# Patient Record
Sex: Male | Born: 1985
Health system: Southern US, Community
[De-identification: ages and names within clinical notes are randomized; demographics above are authoritative.]

## PROBLEM LIST (undated history)

## (undated) DIAGNOSIS — I10 Essential (primary) hypertension: Secondary | ICD-10-CM

## (undated) HISTORY — PX: APPENDECTOMY: SHX54

## (undated) HISTORY — PX: ORBITAL RECONSTRUCTION: SHX2115

## (undated) HISTORY — PX: SHOULDER ARTHROSCOPY: SHX128

## (undated) HISTORY — DX: Essential (primary) hypertension: I10

---
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
## 2007-02-26 ENCOUNTER — Emergency Department (HOSPITAL_COMMUNITY): Admission: EM | Admit: 2007-02-26 | Discharge: 2007-02-26 | Payer: Self-pay | Admitting: Emergency Medicine

## 2007-07-28 ENCOUNTER — Emergency Department (HOSPITAL_COMMUNITY): Admission: EM | Admit: 2007-07-28 | Discharge: 2007-07-28 | Payer: Self-pay | Admitting: Emergency Medicine

## 2008-01-28 ENCOUNTER — Emergency Department (HOSPITAL_COMMUNITY): Admission: EM | Admit: 2008-01-28 | Discharge: 2008-01-28 | Payer: Self-pay | Admitting: Emergency Medicine
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
## 2010-08-14 NOTE — Op Note (Signed)
   NAME:  Nicolas Warren, Nicolas Warren                        ACCOUNT NO.:  [id number]   MEDICAL RECORD NO.:  [id number]                   PATIENT TYPE:  INP   LOCATION:  1826                                 FACILITY:  [facility]   PHYSICIAN:  Prabhakar D. Pendse, M.D.           DATE OF BIRTH:  27-Sep-1985   DATE OF PROCEDURE:  06/18/2002  DATE OF DISCHARGE:                                 OPERATIVE REPORT   PREOPERATIVE DIAGNOSIS:  Acute appendicitis.   POSTOPERATIVE DIAGNOSIS:  Acute appendicitis.   OPERATION/PROCEDURE:  1. Exploratory laparotomy.  2. Appendectomy.   SURGEON:  Prabhakar D. Levie Heritage, M.D.   ASSISTANT:  Nurse.   ANESTHESIA:  General.   DESCRIPTION OF PROCEDURE:  Under satisfactory general endotracheal  anesthesia, the patient in the supine position, abdominal was thoroughly  prepped and draped in the usual manner.  About 4 cm long transverse incision  was made in the right lower quadrant area.  Skin and subcutaneous tissue  were incised.  Bleeders were individually clamped, cut and  electrocoagulated.  All the muscles were incised in the McBurney fashion.  Peritoneal cavity entered.  Exploration revealed moderate quantity of straw-  colored fluid in the right lower quadrant area.  Appendix was about three  inches long but the distal appendix markedly distended, congested with  serositis consistent with acute appendicitis without perforation.  Examination of the distal ileum showed no evidence of obvious ileitis except  for minimal congestion.  The distal ileum also did not show any evidence of  Meckel's diverticulum.  The bowel was returned to the peritoneal cavity.  Appendiceal mesentery was serially clamped, cut and ligated with 3-0 silk.  Appendectomy done in the routine fashion.  Stump was buried in the cecal  wall with 3-0 silk pursestring suture.  Hemostasis being satisfactory and  sponge and needle count being correct, peritoneum was closed with 2-0 Vicryl  running  interlocking sutures.  The incision was irrigated with copious  amount of saline.  Muscles approximated with 2-0 Vicryl interrupted sutures,  subcutaneous tissue with 2-0 Vicryl.  Skin closed with 4-0 Monocryl  subcuticular sutures.  Steri-Strips applied.  Appropriate dressing applied.  Throughout the procedure, the patient's vital signs remained stable.  The  patient withstood the procedure well and was transferred to the recovery  room in satisfactory general condition.                                               Prabhakar D. Levie Heritage, M.D.    PDP/MEDQ  D:  06/18/2002  T:  06/18/2002  Job:  [id number]   cc:   Titus Dubin. Alwyn Ren, M.D. [facility]

## 2011-01-12 LAB — POCT HEMOGLOBIN-HEMACUE: Operator id: 12362

## 2011-04-09 ENCOUNTER — Encounter (HOSPITAL_COMMUNITY): Payer: Self-pay | Admitting: Behavioral Health

## 2011-04-09 ENCOUNTER — Ambulatory Visit (HOSPITAL_COMMUNITY)
Admission: RE | Admit: 2011-04-09 | Discharge: 2011-04-09 | Disposition: A | Discharge status: Home / Self Care | Payer: 59 | Source: Ambulatory Visit | Attending: Psychiatry | Admitting: Psychiatry

## 2011-04-09 DIAGNOSIS — F331 Major depressive disorder, recurrent, moderate: Secondary | ICD-10-CM | POA: Insufficient documentation

## 2011-04-09 DIAGNOSIS — F1020 Alcohol dependence, uncomplicated: Secondary | ICD-10-CM | POA: Insufficient documentation

## 2011-04-09 DIAGNOSIS — F1220 Cannabis dependence, uncomplicated: Secondary | ICD-10-CM | POA: Insufficient documentation
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
## 2012-05-23 ENCOUNTER — Ambulatory Visit: Payer: Self-pay | Admitting: Internal Medicine

## 2012-06-17 ENCOUNTER — Encounter (HOSPITAL_COMMUNITY): Payer: Self-pay | Admitting: Emergency Medicine

## 2012-06-17 ENCOUNTER — Emergency Department (INDEPENDENT_AMBULATORY_CARE_PROVIDER_SITE_OTHER): Payer: 59

## 2012-06-17 ENCOUNTER — Emergency Department (HOSPITAL_COMMUNITY)
Admission: EM | Admit: 2012-06-17 | Discharge: 2012-06-17 | Disposition: A | Discharge status: Home / Self Care | Payer: 59 | Source: Home / Self Care | Attending: Emergency Medicine | Admitting: Emergency Medicine

## 2012-06-17 DIAGNOSIS — M79672 Pain in left foot: Secondary | ICD-10-CM

## 2012-06-17 DIAGNOSIS — M79609 Pain in unspecified limb: Secondary | ICD-10-CM

## 2012-06-17 MED ORDER — MELOXICAM 7.5 MG PO TABS: 7.5000 mg | ORAL_TABLET | Freq: Every day | ORAL | Status: DC
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
## 2012-06-17 NOTE — ED Provider Notes (Signed)
History     CSN: [id number]  Arrival date & time 06/17/12  1514   First MD Initiated Contact with Patient 06/17/12 1519      Chief Complaint  Patient presents with  . 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 He denies any recent injury or falls or any type of trauma that he can recall. Pain is worse and gets exacerbated when he walks swollen when he touches the dorsum aspect of his left foot. Patient denies any numbness or tingling sensation. Have been taking some naproxen that he had left over from a previous shoulder injury doesn't seem to be helping much. Denies any previous injuries or trauma or previous fractures of his left foot.  Patient is a 27 y.o. male presenting with lower extremity pain.  Foot Pain This is a new problem. The current episode started more than 1 week ago. The problem occurs constantly. The problem has not changed since onset.The symptoms are aggravated by walking. The symptoms are relieved by ice, NSAIDs and rest. Treatments tried: Aleve. The treatment provided mild relief.    Past Medical History  Diagnosis Date  . No pertinent past medical history     History reviewed. No pertinent past surgical history.  No family history on file.  History  Substance Use Topics  . Smoking status: Current Every Day Smoker -- 1.00 packs/day    Types: Cigarettes  . Smokeless tobacco: Not on file  . Alcohol Use: 1.8 oz/week    3 Cans of beer per week      Review of Systems  Constitutional: Positive for activity change. Negative for fever.  Musculoskeletal: Negative for back pain, joint swelling and arthralgias.  Skin: Negative for color change, pallor, rash and wound.  Neurological: Negative for weakness and numbness.     Allergies  Review of patient's allergies indicates no known allergies.  Home Medications   Current Outpatient Rx  Name  Route  Sig  Dispense  Refill  . meloxicam (MOBIC) 7.5 MG tablet   Oral   Take 1 tablet (7.5 mg total) by mouth daily. Take one tablet daily for 2 weeks   14 tablet   0     BP 152/92  Pulse 90  Temp(Src) 98.1 F (36.7 C) (Oral)  Resp 18  SpO2 95%  Physical Exam  Constitutional: He appears well-developed and well-nourished.  Musculoskeletal:       Feet:  Neurological: He is alert.  Skin: No rash noted. No erythema.    ED Course  Procedures (including critical care time)  Labs Reviewed - No data to display Dg Foot Complete Left  06/17/2012  *RADIOLOGY REPORT*  Clinical Data: Pain for 2 weeks  LEFT  FOOT COMPLETE - 3+ VIEW  Comparison: None.  Findings: Three views left foot submitted.  No acute fracture or subluxation.  No radiopaque foreign body.  IMPRESSION: No acute fracture or subluxation.   Original Report Authenticated By: Natasha Mead, M.D.      1. Foot pain, left       MDM  Current physical exam and symptoms were most consistent with tenosynovitis of the foot. Have discussed with patient that if pain was to persist especially with weight-bearing activities that he needs to followup with the orthopedic Dr. to be further evaluated. Have encouraged patient to use supportive  shoe wear including an Ace wrap on his using his boots and a shoe insert have also encouraged patient to take meloxicam for the next 10-14 days. Patient agrees with treatment plan and followup care as necessary.   Patient has also noted to be a chronic smoker we also had a discussion of the benefits of smoking cessation. Patient is pre-contemplative about quitting smoking        Jimmie Molly, MD 06/17/12 1726

## 2012-08-15 ENCOUNTER — Encounter: Payer: Self-pay | Admitting: Family Medicine

## 2012-08-15 ENCOUNTER — Ambulatory Visit (INDEPENDENT_AMBULATORY_CARE_PROVIDER_SITE_OTHER): Payer: 59 | Admitting: Family Medicine

## 2012-08-15 VITALS — BP 152/92 | HR 72 | Temp 97.8°F | Resp 18 | Ht 70.5 in | Wt 208.0 lb

## 2012-08-15 DIAGNOSIS — J309 Allergic rhinitis, unspecified: Secondary | ICD-10-CM

## 2012-08-15 DIAGNOSIS — I10 Essential (primary) hypertension: Secondary | ICD-10-CM

## 2012-08-15 HISTORY — DX: Essential (primary) hypertension: I10

## 2012-08-15 MED ORDER — CETIRIZINE HCL 10 MG PO TABS: 10.0000 mg | ORAL_TABLET | Freq: Every day | ORAL | Status: DC

## 2012-08-15 MED ORDER — METHYLPREDNISOLONE ACETATE 40 MG/ML IJ SUSP: 60.0000 mg | Freq: Once | INTRAMUSCULAR | Status: AC

## 2012-08-15 MED ORDER — METHYLPREDNISOLONE ACETATE 40 MG/ML IJ SUSP: 60.0000 mg | Freq: Once | INTRAMUSCULAR | Status: DC

## 2012-08-15 MED ORDER — FLUTICASONE PROPIONATE 50 MCG/ACT NA SUSP: 2.0000 | Freq: Every day | NASAL | Status: DC

## 2012-08-15 MED ORDER — METHYLPREDNISOLONE ACETATE 80 MG/ML IJ SUSP
60.0000 mg | Freq: Once | INTRAMUSCULAR | Status: AC
  Administered 2012-08-15: 60 mg via INTRAMUSCULAR

## 2012-08-15 NOTE — Progress Notes (Signed)
  Subjective:    Patient ID: Nicolas Warren, male    DOB: 03/19/1986, 27 y.o.   MRN: [id number]  HPI  Patient presents with one week of pressure and congestion in his face and sinuses. He's having constant rhinorrhea and postnasal drip. History of some over-the-counter sinus medicine without relief. He denies any fever, headaches, pain in his maxillary or frontal sinuses, or tooth pain. He does have some postnasal drip, sneezing, and cough.  Of note his blood pressure today in clinic is elevated at 152/92. I reviewed his previous office notes in her chart. His diastolic pressure has been around 90 in the past.  He denies any history of hypertension. Past Medical History  Diagnosis Date  . No pertinent past medical history   . HTN (hypertension) 08/15/2012   No current outpatient prescriptions on file prior to visit.   No current facility-administered medications on file prior to visit.   No Known Allergies   Review of Systems  All other systems reviewed and are negative.       Objective:   Physical Exam  Vitals reviewed. Constitutional: He appears well-developed and well-nourished.  HENT:  Head: Normocephalic.  Right Ear: Tympanic membrane, external ear and ear canal normal.  Left Ear: Tympanic membrane, external ear and ear canal normal.  Nose: Mucosal edema and rhinorrhea present. No epistaxis. Right sinus exhibits no maxillary sinus tenderness and no frontal sinus tenderness. Left sinus exhibits no maxillary sinus tenderness and no frontal sinus tenderness.  Mouth/Throat: Uvula is midline, oropharynx is clear and moist and mucous membranes are normal. No oropharyngeal exudate.  Cardiovascular: Normal rate, regular rhythm and normal heart sounds.   No murmur heard. Pulmonary/Chest: Effort normal and breath sounds normal. No respiratory distress. He has no wheezes. He has no rales.  Abdominal: Soft. Bowel sounds are normal. He exhibits no distension. There is no  tenderness. There is no rebound and no guarding.          Assessment & Plan:  1. Allergic rhinosinusitis Past patient start taking Zyrtec 10 mg by mouth daily and Flonase 2 sprays each nostril inhaled daily. He would like to try to expedite the resolution of his symptoms by getting injection of Depo-Medrol 60 mg IM x1. We discussed the risk and benefits of steroids and he elects to proceed.  If fever develops or symptoms worsen, I would be glad to treat him as an infectious sinusitis. However his symptoms appear to be allergies at the present time. - cetirizine (ZYRTEC) 10 MG tablet; Take 1 tablet (10 mg total) by mouth daily.  Dispense: 30 tablet; Refill: 11 - fluticasone (FLONASE) 50 MCG/ACT nasal spray; Place 2 sprays into the nose daily.  Dispense: 16 g; Refill: 6 - methylPREDNISolone acetate (DEPO-MEDROL) injection 60 mg; Inject 1.5 mLs (60 mg total) into the muscle once.  2. HTN (hypertension) Past patient check his blood pressure everyday for the next one to 2 weeks. He will then drop his blood pressure off for me to review. His blood pressures truly elevated and is not due to sinus medication, I would initiate a workup for causes of hypertension in a young man and treat if greater than 140/90.

## 2012-09-09 ENCOUNTER — Encounter (HOSPITAL_COMMUNITY): Payer: Self-pay | Admitting: Emergency Medicine

## 2012-09-09 ENCOUNTER — Emergency Department (INDEPENDENT_AMBULATORY_CARE_PROVIDER_SITE_OTHER)
Admission: EM | Admit: 2012-09-09 | Discharge: 2012-09-09 | Disposition: A | Discharge status: Home / Self Care | Payer: 59 | Source: Home / Self Care

## 2012-09-09 DIAGNOSIS — L089 Local infection of the skin and subcutaneous tissue, unspecified: Secondary | ICD-10-CM

## 2012-09-09 MED ORDER — SULFAMETHOXAZOLE-TRIMETHOPRIM 800-160 MG PO TABS: 1.0000 | ORAL_TABLET | Freq: Two times a day (BID) | ORAL | Status: DC

## 2012-09-09 NOTE — ED Notes (Signed)
Pt c/o infection of right middle finger x 1 wk. Mild swelling Denies fever and drainage. Pt has used advil for pain relief.

## 2012-09-09 NOTE — ED Provider Notes (Signed)
History     CSN: [id number]  Arrival date & time 09/09/12  1435   First MD Initiated Contact with Patient 09/09/12 1552      Chief Complaint  Patient presents with  . Hand Problem    infection of right middle finger x 1 wk.     (Consider location/radiation/quality/duration/timing/severity/associated sxs/prior treatment) Patient is a 27 y.o. male presenting with hand pain. The history is provided by the patient. No language interpreter was used.  Hand Pain This is a new problem. The current episode started more than 1 week ago. The problem occurs constantly. Nothing aggravates the symptoms. Nothing relieves the symptoms. He has tried nothing for the symptoms.   Pt reports finger is swollen and painful,  Began from wearing wet gloves at work Past Medical History  Diagnosis Date  . No pertinent past medical history   . HTN (hypertension) 08/15/2012    History reviewed. No pertinent past surgical history.  History reviewed. No pertinent family history.  History  Substance Use Topics  . Smoking status: Current Some Day Smoker -- 1.00 packs/day    Types: Cigarettes  . Smokeless tobacco: Never Used  . Alcohol Use: 1.8 oz/week    3 Cans of beer per week      Review of Systems  All other systems reviewed and are negative.    Allergies  Review of patient's allergies indicates no known allergies.  Home Medications   Current Outpatient Rx  Name  Route  Sig  Dispense  Refill  . cetirizine (ZYRTEC) 10 MG tablet   Oral   Take 1 tablet (10 mg total) by mouth daily.   30 tablet   11   . fluticasone (FLONASE) 50 MCG/ACT nasal spray   Nasal   Place 2 sprays into the nose daily.   16 g   6     BP 129/75  Pulse 60  Resp 16  SpO2 97%  Physical Exam  Nursing note and vitals reviewed. Constitutional: He is oriented to person, place, and time. He appears well-developed and well-nourished.  HENT:  Head: Normocephalic.  Musculoskeletal: He exhibits tenderness.   Swollen right 3rd finger,    Neurological: He is alert and oriented to person, place, and time. He has normal reflexes.  Skin: Skin is warm.  Psychiatric: He has a normal mood and affect.    ED Course  Procedures (including critical care time)  Labs Reviewed - No data to display No results found.   No diagnosis found.    MDM  I attempted to open with 18 gauge,  No drainage  Pt advised to soak area,   Rx for bactrim         Elson Areas, PA-C 09/09/12 1628

## 2012-09-12 ENCOUNTER — Encounter: Payer: Self-pay | Admitting: Family Medicine

## 2012-09-12 ENCOUNTER — Ambulatory Visit (INDEPENDENT_AMBULATORY_CARE_PROVIDER_SITE_OTHER): Payer: 59 | Admitting: Family Medicine

## 2012-09-12 VITALS — BP 130/80 | HR 86 | Temp 97.4°F | Resp 20 | Ht 71.0 in | Wt 212.0 lb

## 2012-09-12 DIAGNOSIS — IMO0002 Reserved for concepts with insufficient information to code with codable children: Secondary | ICD-10-CM

## 2012-09-12 DIAGNOSIS — L03011 Cellulitis of right finger: Secondary | ICD-10-CM

## 2012-09-12 MED ORDER — HYDROCODONE-ACETAMINOPHEN 5-325 MG PO TABS: 1.0000 | ORAL_TABLET | Freq: Four times a day (QID) | ORAL | Status: DC | PRN

## 2012-09-12 NOTE — Progress Notes (Signed)
  Subjective:    Patient ID: Nicolas Warren, male    DOB: March 23, 1986, 27 y.o.   MRN: [id number]  HPI  Patient here secondary to infection on the tip of his right finger. He was seen in urgent care 3 days ago was given prescription for Bactrim after ? Aspiration was attempted unsuccessful. He continues to have swelling and pain of the finger. He states it started after wearing his clothes from work which he did not clean out. He denies any fever or drainage.    Review of Systems - per above GEN- denies fatigue, fever, weight loss,weakness, recent illness MSK- denies joint pain, muscle aches, injury        Objective:   Physical Exam GEN-NAD,alert and oriented x 3 SKIN- Right hand middle finger + moderate size paranychia along nail fold toward right side of nail TTP, no drainage, + erythema. Black debris around the nail bed MSK- normal ROM PIP, MIP,DIP Fingers  Procedure- Incision and Drainage Procedure explained to patient questions answered benefits and risks discussed oral consent obtained. Antiseptic-Betadine Anesthesia-lidocaine - nerve block middle finger Incision performed in j shape around nail- small amount of pus expressed Minimal blood loss Patient tolerated procedure well Bandage applied      Assessment & Plan:

## 2012-09-12 NOTE — Assessment & Plan Note (Signed)
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

## 2012-09-12 NOTE — Patient Instructions (Addendum)
Complete antibiotics Take bandage off after 48hours Soak in warm water four times a day If this swelling is not improving or infection worsens call for appt for Friday Fingertip Infection When an infection is around the nail, it is called a paronychia. When it appears over the tip of the finger, it is called a felon. These infections are due to minor injuries or cracks in the skin. If they are not treated properly, they can lead to bone infection and permanent damage to the fingernail. Incision and drainage is necessary if a pus pocket (an abscess) has formed. Antibiotics and pain medicine may also be needed. Keep your hand elevated for the next 2-3 days to reduce swelling and pain. If a pack was placed in the abscess, it should be removed in 1-2 days by your caregiver. Soak the finger in warm water for 20 minutes 4 times daily to help promote drainage. Keep the hands as dry as possible. Wear protective gloves with cotton liners. See your caregiver for follow-up care as recommended.  HOME CARE INSTRUCTIONS   Keep wound clean, dry and dressed as suggested by your caregiver.  Soak in warm salt water for fifteen minutes, four times per day for bacterial infections.  Your caregiver will prescribe an antibiotic if a bacterial infection is suspected. Take antibiotics as directed and finish the prescription, even if the problem appears to be improving before the medicine is gone.  Only take over-the-counter or prescription medicines for pain, discomfort, or fever as directed by your caregiver. SEEK IMMEDIATE MEDICAL CARE IF:  There is redness, swelling, or increasing pain in the wound.  Pus or any other unusual drainage is coming from the wound.  An unexplained oral temperature above 102 F (38.9 C) develops.  You notice a foul smell coming from the wound or dressing. MAKE SURE YOU:   Understand these instructions.  Monitor your condition.  Contact your caregiver if you are getting worse or  not improving. Document Released: 04/22/2004 Document Revised: 06/07/2011 Document Reviewed: 04/18/2008 [facility] Patient Information 2014 [place], [place].

## 2013-06-03 ENCOUNTER — Emergency Department: Payer: Self-pay | Admitting: Emergency Medicine

## 2013-06-04 ENCOUNTER — Ambulatory Visit: Payer: BC Managed Care – PPO

## 2013-08-13 ENCOUNTER — Encounter: Payer: Self-pay | Admitting: Family Medicine

## 2013-08-13 ENCOUNTER — Ambulatory Visit (INDEPENDENT_AMBULATORY_CARE_PROVIDER_SITE_OTHER): Payer: BC Managed Care – PPO | Admitting: Family Medicine

## 2013-08-13 VITALS — BP 126/74 | HR 74 | Temp 97.9°F | Resp 18 | Ht 71.0 in | Wt 213.0 lb

## 2013-08-13 DIAGNOSIS — N50812 Left testicular pain: Secondary | ICD-10-CM

## 2013-08-13 DIAGNOSIS — N509 Disorder of male genital organs, unspecified: Secondary | ICD-10-CM

## 2013-08-13 LAB — URINALYSIS, ROUTINE W REFLEX MICROSCOPIC
Bilirubin Urine: NEGATIVE
Glucose, UA: NEGATIVE mg/dL
HGB URINE DIPSTICK: NEGATIVE
Ketones, ur: NEGATIVE mg/dL
LEUKOCYTES UA: NEGATIVE
Nitrite: NEGATIVE
PROTEIN: NEGATIVE mg/dL
Urobilinogen, UA: 0.2 mg/dL (ref 0.0–1.0)
pH: 6 (ref 5.0–8.0)

## 2013-08-13 NOTE — Progress Notes (Signed)
Subjective:    Patient ID: Nicolas Warren, male    DOB: 07/19/1985, [age] y.o.   MRN: [id number]  HPI Patient is concerned he may have developed an inguinal canal hernia. He does a lot of heavy lifting at work. Over the last week he has had aching pain in his left inguinal canal radiating into his left testicle. He denies any masses or swelling in the inguinal canal or in the scrotum. The left testicle is tender to palpation. Examination today the left testicle is not swollen or tender to palpation. The epididymis is not tender to palpation. There is no palpable inguinal hernia. There is no inguinal lymphadenopathy. The patient denies any dysuria or urethral discharge he states it's possible he has been exposed STI's. Past Medical History  Diagnosis Date  . No pertinent past medical history   . HTN (hypertension) 08/15/2012   No current outpatient prescriptions on file prior to visit.   Current Facility-Administered Medications on File Prior to Visit  Medication Dose Route Frequency Provider Last Rate Last Dose  . methylPREDNISolone acetate (DEPO-MEDROL) injection 60 mg  60 mg Intramuscular Once Donita BrooksWarren T Pickard, MD       No Known Allergies History   Social History  . Marital Status: Single    Spouse Name: N/A    Number of Children: N/A  . Years of Education: N/A   Occupational History  . Not on file.   Social History Main Topics  . Smoking status: Current Some Day Smoker -- 1.00 packs/day    Types: Cigarettes  . Smokeless tobacco: Never Used  . Alcohol Use: 1.8 oz/week    3 Cans of beer per week  . Drug Use: 4.00 per week    Special: Marijuana  . Sexual Activity: Not on file   Other Topics Concern  . Not on file   Social History Narrative  . No narrative on file      Review of Systems  All other systems reviewed and are negative.      Objective:   Physical Exam  Vitals reviewed. Cardiovascular: Normal rate, regular rhythm and normal heart sounds.     Pulmonary/Chest: Effort normal and breath sounds normal. No respiratory distress. He has no wheezes. He has no rales.  Abdominal: Soft. Bowel sounds are normal. He exhibits no distension. There is no tenderness. There is no rebound. Hernia confirmed negative in the right inguinal area and confirmed negative in the left inguinal area.  Genitourinary: Testes normal and penis normal. Cremasteric reflex is present. Right testis shows no mass, no swelling and no tenderness. Left testis shows no mass, no swelling and no tenderness. Left testis is descended. Circumcised. No phimosis, paraphimosis, hypospadias, penile erythema or penile tenderness. No discharge found.  Lymphadenopathy:       Right: No inguinal adenopathy present.       Left: No inguinal adenopathy present.          Assessment & Plan:  1. Testicular pain, left Today's exam is benign. The differential diagnosis includes epididymitis, inguinal hernia, muscle strain, benign testicular pain.  Do not appreciate a hernia today on examination. I do not appreciate any testicular masses. There is no evidence of a testicular torsion. I believe most likely the patient has strained a muscle there could be a clinically inapparent inguinal hernia. Rule out urinary tract infection and STI. I recommended ice and ibuprofen over the next week. If the patient's pain gradually improved no further workup is necessary. If the pain  worsens I would proceed with a ultrasound of the scrotum. - Urinalysis, Routine w reflex microscopic - GC/chlamydia probe amp, urine

## 2013-08-14 LAB — GC/CHLAMYDIA PROBE AMP, URINE
Chlamydia, Swab/Urine, PCR: NEGATIVE
GC Probe Amp, Urine: NEGATIVE

## 2013-10-30 ENCOUNTER — Encounter: Payer: Self-pay | Admitting: Family Medicine

## 2013-10-30 ENCOUNTER — Ambulatory Visit (INDEPENDENT_AMBULATORY_CARE_PROVIDER_SITE_OTHER): Payer: BC Managed Care – PPO | Admitting: Family Medicine

## 2013-10-30 VITALS — BP 110/68 | HR 80 | Temp 98.3°F | Resp 14 | Ht 71.0 in | Wt 211.0 lb

## 2013-10-30 DIAGNOSIS — J029 Acute pharyngitis, unspecified: Secondary | ICD-10-CM

## 2013-10-30 LAB — RAPID STREP SCREEN (MED CTR MEBANE ONLY): Streptococcus, Group A Screen (Direct): NEGATIVE

## 2013-10-30 NOTE — Progress Notes (Signed)
   Subjective:    Patient ID: Nicolas Warren, male    DOB: 11/11/1985, 28 y.o.   MRN: [id number]  HPI Patient symptoms began yesterday. He developed postnasal drip and a sore throat. Patient states that his friend had a sore throat last week and he believes that his friend has given him his infection. He reports subjective fevers. He does report rhinorrhea and nasal congestion. He also reports a nonproductive cough. He denies any otalgia or sinus pain. He denies any shortness of breath or chest pain. There is no rash on the skin. Past Medical History  Diagnosis Date  . No pertinent past medical history   . HTN (hypertension) 08/15/2012   No current outpatient prescriptions on file prior to visit.   Current Facility-Administered Medications on File Prior to Visit  Medication Dose Route Frequency Provider Last Rate Last Dose  . methylPREDNISolone acetate (DEPO-MEDROL) injection 60 mg  60 mg Intramuscular Once Donita BrooksWarren T Pickard, MD       No Known Allergies History   Social History  . Marital Status: Single    Spouse Name: N/A    Number of Children: N/A  . Years of Education: N/A   Occupational History  . Not on file.   Social History Main Topics  . Smoking status: Current Some Day Smoker -- 1.00 packs/day    Types: Cigarettes  . Smokeless tobacco: Never Used  . Alcohol Use: 1.8 oz/week    3 Cans of beer per week  . Drug Use: 4.00 per week    Special: Marijuana  . Sexual Activity: Not on file   Other Topics Concern  . Not on file   Social History Narrative  . No narrative on file      Review of Systems  All other systems reviewed and are negative.      Objective:   Physical Exam  Vitals reviewed. Constitutional: He appears well-developed and well-nourished. No distress.  HENT:  Right Ear: External ear normal.  Left Ear: External ear normal.  Nose: Nose normal. No mucosal edema or rhinorrhea. Right sinus exhibits no maxillary sinus tenderness and no frontal  sinus tenderness. Left sinus exhibits no maxillary sinus tenderness and no frontal sinus tenderness.  Mouth/Throat: Uvula is midline and mucous membranes are normal. Posterior oropharyngeal erythema present. No oropharyngeal exudate, posterior oropharyngeal edema or tonsillar abscesses.  Neck: Neck supple.  Cardiovascular: Normal rate, regular rhythm and normal heart sounds.   Pulmonary/Chest: Effort normal and breath sounds normal. No respiratory distress. He has no wheezes. He has no rales.  Lymphadenopathy:    He has no cervical adenopathy.  Skin: No rash noted. He is not diaphoretic. No erythema.          Assessment & Plan:  1. Sore throat Strep screen is negative. Symptoms and exam are consistent with viral pharyngitis Duke's magic mouthwash 5 mL po gargle and swallow q 4 hrs prn sore throat.  Recommend tincture of time and I anticipate spontaneous resolution over the next 4-5 days.  Gargle salt water QID.  Motrin 800 mg poq 8hrs prn discomfort. - Rapid strep screen

## 2014-01-14 ENCOUNTER — Encounter: Payer: Self-pay | Admitting: Family Medicine

## 2014-01-14 ENCOUNTER — Ambulatory Visit (INDEPENDENT_AMBULATORY_CARE_PROVIDER_SITE_OTHER): Payer: BC Managed Care – PPO | Admitting: Family Medicine

## 2014-01-14 VITALS — BP 126/76 | HR 68 | Temp 98.3°F | Resp 18 | Ht 71.0 in | Wt 217.0 lb

## 2014-01-14 DIAGNOSIS — I889 Nonspecific lymphadenitis, unspecified: Secondary | ICD-10-CM

## 2014-01-14 MED ORDER — AMOXICILLIN 875 MG PO TABS: 875.0000 mg | ORAL_TABLET | Freq: Two times a day (BID) | ORAL | Status: DC

## 2014-01-14 NOTE — Progress Notes (Signed)
   Subjective:    Patient ID: Nicolas Warren, male    DOB: 10/22/1985, 28 y.o.   MRN: [id number]  HPI Patient has tenderness and pain in his left neck just below the angle of his mandible near the anterior cervical chain. There are no palpable abnormalities in this area but this area is tender and sore. He denies any fevers or chills. He denies any sore throat. He denies any otalgia. He denies any cough or rhinorrhea. He has recently been around his nephews who have all had upper respiratory infections. Past Medical History  Diagnosis Date  . No pertinent past medical history   . HTN (hypertension) 08/15/2012   No past surgical history on file. No current outpatient prescriptions on file prior to visit.   Current Facility-Administered Medications on File Prior to Visit  Medication Dose Route Frequency Provider Last Rate Last Dose  . methylPREDNISolone acetate (DEPO-MEDROL) injection 60 mg  60 mg Intramuscular Once Donita BrooksWarren T Hayde Kilgour, MD       No Known Allergies History   Social History  . Marital Status: Single    Spouse Name: N/A    Number of Children: N/A  . Years of Education: N/A   Occupational History  . Not on file.   Social History Main Topics  . Smoking status: Current Some Day Smoker -- 1.00 packs/day    Types: Cigarettes  . Smokeless tobacco: Never Used  . Alcohol Use: 1.8 oz/week    3 Cans of beer per week  . Drug Use: 4.00 per week    Special: Marijuana  . Sexual Activity: Not on file   Other Topics Concern  . Not on file   Social History Narrative  . No narrative on file      Review of Systems  All other systems reviewed and are negative.      Objective:   Physical Exam  Vitals reviewed. Constitutional: He appears well-developed and well-nourished. No distress.  HENT:  Nose: Nose normal.  Mouth/Throat: Oropharynx is clear and moist. No oropharyngeal exudate.  Eyes: Conjunctivae are normal. No scleral icterus.  Neck: Neck supple. No  tracheal deviation present.  Cardiovascular: Normal rate, regular rhythm and normal heart sounds.   No murmur heard. Pulmonary/Chest: Effort normal and breath sounds normal. No stridor. No respiratory distress. He has no wheezes. He has no rales.  Abdominal: Soft. Bowel sounds are normal. He exhibits no distension and no mass. There is no tenderness. There is no rebound and no guarding.  Lymphadenopathy:    He has no cervical adenopathy.  Skin: Skin is warm. No rash noted. He is not diaphoretic. No erythema. No pallor.   patient is mildly tender to palpation in the left neck just below the angle of the mandible near the anterior cervical chain        Assessment & Plan:  Lymphadenitis - Plan: amoxicillin (AMOXIL) 875 MG tablet   Pain either represent a swollen tender lymph node that I am just not able to palpate or possibly a strain in the sternocleidomastoid muscle. I explained to the patient that I would not be surprised if he develops symptoms of an upper respiratory infection in the next 48-72 hours including rhinorrhea cough and sore throat. If he does, I recommended that he just treat the symptoms symptomatically and wait it out. If he develops a severe sore throat with exudate, I gave him a prescription for amoxicillin. However I anticipate self limited resolution of the next 72 hours.

## 2015-06-21 ENCOUNTER — Emergency Department
Admission: EM | Admit: 2015-06-21 | Discharge: 2015-06-21 | Disposition: A | Discharge status: Home / Self Care | Payer: BLUE CROSS/BLUE SHIELD | Source: Home / Self Care | Attending: Family Medicine | Admitting: Family Medicine

## 2015-06-21 DIAGNOSIS — R05 Cough: Secondary | ICD-10-CM

## 2015-06-21 DIAGNOSIS — J029 Acute pharyngitis, unspecified: Secondary | ICD-10-CM | POA: Diagnosis not present

## 2015-06-21 DIAGNOSIS — R059 Cough, unspecified: Secondary | ICD-10-CM

## 2015-06-21 LAB — POCT RAPID STREP A (OFFICE): Rapid Strep A Screen: NEGATIVE

## 2015-06-21 MED ORDER — BENZONATATE 100 MG PO CAPS: 100.0000 mg | ORAL_CAPSULE | Freq: Three times a day (TID) | ORAL | Status: DC

## 2015-06-21 NOTE — ED Provider Notes (Signed)
CSN: [id number]     Arrival date & time 06/21/15  1652 History   First MD Initiated Contact with Patient 06/21/15 1716     Chief Complaint  Patient presents with  . Sore Throat   (Consider location/radiation/quality/duration/timing/severity/associated sxs/prior Treatment) HPI  The pt is a 30yo male [facility] with sore throat and mild intermittent productive cough that started yesterday afternoon.  Pt reports coughing up clear mucous and having a mild generalized headache, worse this morning. He did take benadryl but no relief of sore throat. Denies sick contacts or recent travel.    Past Medical History  Diagnosis Date  . No pertinent past medical history   . HTN (hypertension) 08/15/2012   History reviewed. No pertinent past surgical history. History reviewed. No pertinent family history. Social History  Substance Use Topics  . Smoking status: Current Some Day Smoker -- 1.00 packs/day    Types: Cigarettes  . Smokeless tobacco: Never Used  . Alcohol Use: 1.8 oz/week    3 Cans of beer per week    Review of Systems  Constitutional: Negative for fever and chills.  HENT: Positive for congestion and sore throat. Negative for ear pain, trouble swallowing and voice change.   Respiratory: Positive for cough. Negative for shortness of breath.   Cardiovascular: Negative for chest pain and palpitations.  Gastrointestinal: Negative for nausea, vomiting, abdominal pain and diarrhea.  Musculoskeletal: Negative for myalgias, back pain and arthralgias.  Skin: Negative for rash.  Neurological: Positive for headaches. Negative for dizziness and light-headedness.    Allergies  Review of patient's allergies indicates no known allergies.  Home Medications   Prior to Admission medications   Medication Sig Start Date End Date Taking? Authorizing Provider  benzonatate (TESSALON) 100 MG capsule Take 1-2 capsules (100-200 mg total) by mouth every 8 (eight) hours. 06/21/15   Junius FinnerErin O'Malley,  PA-C   Meds Ordered and Administered this Visit  Medications - No data to display  BP 149/94 mmHg  Pulse 98  Temp(Src) 98 F (36.7 C) (Oral)  Ht 5\' 11"  (1.803 m)  Wt 215 lb 4 oz (97.637 kg)  BMI 30.03 kg/m2  SpO2 98% No data found.   Physical Exam  Constitutional: He appears well-developed and well-nourished.  HENT:  Head: Normocephalic and atraumatic.  Right Ear: Tympanic membrane normal.  Left Ear: Tympanic membrane normal.  Nose: Nose normal.  Mouth/Throat: Uvula is midline and mucous membranes are normal. Posterior oropharyngeal erythema present. No oropharyngeal exudate, posterior oropharyngeal edema or tonsillar abscesses.  Eyes: Conjunctivae are normal. No scleral icterus.  Neck: Normal range of motion. Neck supple.  Cardiovascular: Normal rate, regular rhythm and normal heart sounds.   Pulmonary/Chest: Effort normal and breath sounds normal. No stridor. No respiratory distress. He has no wheezes. He has no rales.  Abdominal: Soft. He exhibits no distension. There is no tenderness.  Musculoskeletal: Normal range of motion.  Lymphadenopathy:    He has no cervical adenopathy.  Neurological: He is alert.  Skin: Skin is warm and dry.  Nursing note and vitals reviewed.   ED Course  Procedures (including critical care time)  Labs Review Labs Reviewed  POCT RAPID STREP A (OFFICE)    Imaging Review No results found.    MDM   1. Sore throat   2. Cough    Pt c/o sore throat and cough since yesterday.  Mild pharyngeal erythema, otherwise normal exam. Rapid strep: Negative  Symptoms likely viral in nature. Rx: Tessalon  Advised pt to use  acetaminophen and ibuprofen as needed for fever and pain. Encouraged rest and fluids. F/u with PCP in 7-10 days if not improving, sooner if worsening. Pt verbalized understanding and agreement with tx plan.     Junius Finner, PA-C 06/22/15 1709

## 2015-06-21 NOTE — ED Notes (Signed)
Started with a sore throat yesterday afternoon.  Scratchy, headache, worse this am, coughing up mucous,

## 2015-06-21 NOTE — Discharge Instructions (Signed)
You may take 400-600mg Ibuprofen (Motrin) every 6-8 hours for fever and pain  °Alternate with Tylenol  °You may take 500mg Tylenol every 4-6 hours as needed for fever and pain  °Follow-up with your primary care provider next week for recheck of symptoms if not improving.  °Be sure to drink plenty of fluids and rest, at least 8hrs of sleep a night, preferably more while you are sick. °Return urgent care or go to closest ER if you cannot keep down fluids/signs of dehydration, fever not reducing with Tylenol, difficulty breathing/wheezing, stiff neck, worsening condition, or other concerns (see below)  ° ° °Cool Mist Vaporizers °Vaporizers may help relieve the symptoms of a cough and cold. They add moisture to the air, which helps mucus to become thinner and less sticky. This makes it easier to breathe and cough up secretions. Cool mist vaporizers do not cause serious burns like hot mist vaporizers, which may also be called steamers or humidifiers. Vaporizers have not been proven to help with colds. You should not use a vaporizer if you are allergic to mold. °HOME CARE INSTRUCTIONS °· Follow the package instructions for the vaporizer. °· Do not use anything other than distilled water in the vaporizer. °· Do not run the vaporizer all of the time. This can cause mold or bacteria to grow in the vaporizer. °· Clean the vaporizer after each time it is used. °· Clean and dry the vaporizer well before storing it. °· Stop using the vaporizer if worsening respiratory symptoms develop. °  °This information is not intended to replace advice given to you by your health care provider. Make sure you discuss any questions you have with your health care provider. °  °Document Released: 12/11/2003 Document Revised: 03/20/2013 Document Reviewed: 08/02/2012 °Elsevier Interactive Patient Education ©2016 Elsevier Inc. ° °

## 2015-12-07 ENCOUNTER — Encounter: Payer: Self-pay | Admitting: Emergency Medicine

## 2015-12-07 ENCOUNTER — Emergency Department
Admission: EM | Admit: 2015-12-07 | Discharge: 2015-12-07 | Disposition: A | Discharge status: Home / Self Care | Payer: BLUE CROSS/BLUE SHIELD | Source: Home / Self Care | Attending: Family Medicine | Admitting: Family Medicine

## 2015-12-07 DIAGNOSIS — B356 Tinea cruris: Secondary | ICD-10-CM

## 2015-12-07 MED ORDER — TOLNAFTATE 1 % EX CREA: 1.0000 "application " | TOPICAL_CREAM | Freq: Two times a day (BID) | CUTANEOUS | 0 refills | Status: DC

## 2015-12-07 NOTE — ED Triage Notes (Signed)
Pt c/o possible heat rash in the left inner thigh, no itching just redness.

## 2015-12-07 NOTE — ED Provider Notes (Signed)
CSN: [id number]     Arrival date & time 12/07/15  1102 History   First MD Initiated Contact with Patient 12/07/15 1121     Chief Complaint  Patient presents with  . Rash   (Consider location/radiation/quality/duration/timing/severity/associated sxs/prior Treatment) HPI  Jamarius Saha Lybrand is a 30 y.o. male presenting to UC with c/o red dry mildly itchy rash that started about 3-4 days ago in Left groin.  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  He reports using Gold Bond powder in area to help reduce moisture but is concerned now he has an infection.  He did start using OTC Lamisil w/o relief.  He notes he has been with the same sexual partner "for a while" he has low concern for STD but he wants to make sure due to area rash is in.  Denies penile pain, discharge, or dysuria.  Denies rashes elsewhere. No new soaps, lotions, or medications.    Past Medical History:  Diagnosis Date  . HTN (hypertension) 08/15/2012  . No pertinent past medical history    History reviewed. No pertinent surgical history. No family history on file. Social History  Substance Use Topics  . Smoking status: Current Some Day Smoker    Packs/day: 1.00    Types: Cigarettes  . Smokeless tobacco: Never Used  . Alcohol use 1.8 oz/week    3 Cans of beer per week    Review of Systems  Gastrointestinal: Negative for diarrhea, nausea and vomiting.  Genitourinary: Negative for discharge, dysuria, genital sores, hematuria, penile pain, penile swelling and scrotal swelling.  Skin: Positive for rash. Negative for wound.    Allergies  Review of patient's allergies indicates no known allergies.  Home Medications   Prior to Admission medications   Medication Sig Start Date End Date Taking? Authorizing Provider  tolnaftate (TINACTIN) 1 % cream Apply 1 application topically 2 (two) times daily. For up to 2  weeks 12/07/15   Junius Finner, PA-C   Meds Ordered and Administered this Visit  Medications - No data to display  BP 124/80 (BP Location: Left Arm)   Pulse 62   Temp 98.2 F (36.8 C) (Oral)   Ht 5\' 11"  (1.803 m)   Wt 209 lb 12 oz (95.1 kg)   SpO2 98%   BMI 29.25 kg/m  No data found.   Physical Exam  Constitutional: He is oriented to person, place, and time. He appears well-developed and well-nourished.  HENT:  Head: Normocephalic and atraumatic.  Eyes: EOM are normal.  Neck: Normal range of motion.  Cardiovascular: Normal rate.   Pulmonary/Chest: Effort normal.  Genitourinary:     Genitourinary Comments: Left groin: erythematous dry scaling patchy rash with well defined borders. Non-tender. No bleeding or discharge. No vesicles.   Musculoskeletal: Normal range of motion.  Neurological: He is alert and oriented to person, place, and time.  Skin: Skin is warm and dry. Rash noted.  Psychiatric: He has a normal mood and affect. His behavior is normal.  Nursing note and vitals reviewed.   Urgent Care Course   Clinical Course    Procedures (including critical care time)  Labs Review Labs Reviewed - No data to display  Imaging Review No results found.    MDM   1. Tinea cruris    Rash c/w Tinea cruis.  Reassured pt, rash not c/w STDs. Rx: Tinactin  Encouraged to f/u with PCP if not improving in 2-3 weeks.  Patient verbalized understanding and agreement with treatment plan.     Junius Finnerrin O'Malley, PA-C 12/07/15 1139

## 2016-06-10 ENCOUNTER — Ambulatory Visit: Payer: Self-pay | Admitting: Physician Assistant

## 2016-06-10 ENCOUNTER — Emergency Department
Admission: EM | Admit: 2016-06-10 | Discharge: 2016-06-10 | Disposition: A | Discharge status: Home / Self Care | Payer: BLUE CROSS/BLUE SHIELD | Source: Home / Self Care | Attending: Emergency Medicine | Admitting: Emergency Medicine

## 2016-06-10 DIAGNOSIS — J028 Acute pharyngitis due to other specified organisms: Secondary | ICD-10-CM

## 2016-06-10 LAB — POCT RAPID STREP A (OFFICE): Rapid Strep A Screen: NEGATIVE

## 2016-06-10 MED ORDER — AMOXICILLIN 875 MG PO TABS: ORAL_TABLET | ORAL | 0 refills | Status: DC

## 2016-06-10 NOTE — ED Provider Notes (Signed)
[facility]    CSN: [id number] Arrival date & time: 06/10/16  1607     History   Chief Complaint Chief Complaint  Patient presents with  . Sore Throat    HPI Nicolas Warren is a 31 y.o. male.   HPI Started 3 days ago with sore throat.   hurt the most for the next 2 days.   Today some better, but throat is red. Severity: Moderate Tried OTC meds without significant relief.  Symptoms:  + low-grade Fever  + Swollen neck glands No Recent Strep Exposure     No Myalgias No Headache No Rash  mild Discolored Nasal Mucus No Allergy symptoms No sinus pain/pressure No itchy/red eyes No earache  No Drooling No Trismus  No Nausea No Vomiting No Abdominal pain No Diarrhea No Reflux symptoms  No Cough No Breathing Difficulty No Shortness of Breath No pleuritic pain No Wheezing No Hemoptysis   Past Medical History:  Diagnosis Date  . HTN (hypertension) 08/15/2012  . No pertinent past medical history     Patient Active Problem List   Diagnosis Date Noted  . Paronychia 09/12/2012  . HTN (hypertension) 08/15/2012    Past Surgical History:  Procedure Laterality Date  . APPENDECTOMY    . ORBITAL RECONSTRUCTION    . SHOULDER ARTHROSCOPY         Home Medications    Prior to Admission medications   Medication Sig Start Date End Date Taking? Authorizing Provider  amoxicillin (AMOXIL) 875 MG tablet Take 1 twice a day X 10 days. 06/10/16   Lajean Manesavid Miabella Shannahan, MD    Family History Family History  Problem Relation Age of Onset  . Cancer Mother   . Heart failure Father     Social History Social History  Substance Use Topics  . Smoking status: Current Some Day Smoker    Packs/day: 1.00    Types: Cigarettes  . Smokeless tobacco: Never Used  . Alcohol use 1.8 oz/week    3 Cans of beer per week     Allergies   Patient has no known allergies.   Review of Systems Review of Systems  All other systems reviewed and are  negative.    Physical Exam Triage Vital Signs ED Triage Vitals  Enc Vitals Group     BP 06/10/16 1633 123/86     Pulse Rate 06/10/16 1633 75     Resp --      Temp 06/10/16 1633 98.1 F (36.7 C)     Temp Source 06/10/16 1633 Oral     SpO2 06/10/16 1633 95 %     Weight 06/10/16 1634 214 lb (97.1 kg)     Height 06/10/16 1634 5\' 11"  (1.803 m)     Head Circumference --      Peak Flow --      Pain Score --      Pain Loc --      Pain Edu? --      Excl. in GC? --    No data found.   Updated Vital Signs BP 123/86 (BP Location: Left Arm)   Pulse 75   Temp 98.1 F (36.7 C) (Oral)   Ht 5\' 11"  (1.803 m)   Wt 214 lb (97.1 kg)   SpO2 95%   BMI 29.85 kg/m   Visual Acuity Right Eye Distance:   Left Eye Distance:   Bilateral Distance:    Right Eye Near:   Left Eye Near:    Bilateral Near:  Physical Exam  Constitutional: He is oriented to person, place, and time. He appears well-developed and well-nourished. He is cooperative.  Non-toxic appearance. No distress.  HENT:  Head: Normocephalic and atraumatic.  Right Ear: Tympanic membrane, external ear and ear canal normal.  Left Ear: Tympanic membrane, external ear and ear canal normal.  Nose: Nose normal. Right sinus exhibits no maxillary sinus tenderness and no frontal sinus tenderness. Left sinus exhibits no maxillary sinus tenderness and no frontal sinus tenderness.  Mouth/Throat: Mucous membranes are normal. Posterior oropharyngeal erythema present. No oropharyngeal exudate or posterior oropharyngeal edema.  Eyes: Conjunctivae are normal. No scleral icterus.  Neck: Neck supple.  Cardiovascular: Normal rate, regular rhythm and normal heart sounds.   No murmur heard. Pulmonary/Chest: Effort normal and breath sounds normal. No stridor. No respiratory distress. He has no wheezes. He has no rales.  Musculoskeletal: He exhibits no edema.  Lymphadenopathy:    He has cervical adenopathy.       Right cervical: Superficial  cervical adenopathy present. No deep cervical and no posterior cervical adenopathy present.      Left cervical: Superficial cervical adenopathy present. No deep cervical and no posterior cervical adenopathy present.  Neurological: He is alert and oriented to person, place, and time.  Skin: Skin is warm and dry.  Psychiatric: He has a normal mood and affect.  Nursing note and vitals reviewed.    UC Treatments / Results  Labs (all labs ordered are listed, but only abnormal results are displayed) Labs Reviewed  STREP A DNA PROBE   Narrative:    Performed at:  [facility]                [address]                [place], [place] [address]  POCT RAPID STREP A (OFFICE)    EKG  EKG Interpretation None       Radiology No results found.  Procedures Procedures (including critical care time)  Medications Ordered in UC Medications - No data to display   Initial Impression / Assessment and Plan / UC Course  I have reviewed the triage vital signs and the nursing notes.  Pertinent labs & imaging results that were available during my care of the patient were reviewed by me and considered in my medical decision making (see chart for details).    RST neg. Strep cx sent  Final Clinical Impressions(s) / UC Diagnoses   Final diagnoses:  Pharyngitis due to other organism  Likely viral. Symptomatic care discussed.  New Prescriptions Discharge Medication List as of 06/10/2016  4:55 PM    START taking these medications   Details  amoxicillin (AMOXIL) 875 MG tablet Take 1 twice a day X 10 days., Print      Printed rx Amox, to fill if no better in 4 days. Other Advice given Follow-up with your primary care doctor in 5-7 days if not improving, or sooner if symptoms become worse. Precautions discussed. Red flags discussed. Questions invited and answered. Patient voiced understanding and agreement.    Lajean Manes, MD 06/11/16 [place]

## 2016-06-10 NOTE — ED Triage Notes (Signed)
Started Monday with sore throat.  Tuesday and yesterday hurt the most.  Today some better, but throat is red.

## 2016-06-11 ENCOUNTER — Telehealth: Payer: Self-pay | Admitting: Emergency Medicine

## 2016-06-11 LAB — STREP A DNA PROBE: GASP: NOT DETECTED

## 2016-06-11 NOTE — Telephone Encounter (Signed)
Culture is negative, he is feeling better today.

## 2016-06-12 ENCOUNTER — Telehealth: Payer: Self-pay | Admitting: Emergency Medicine

## 2016-06-12 NOTE — Telephone Encounter (Signed)
Pt states that he is feeling better, will f/u w/PCP if needed.  TMartin,CMA

## 2016-07-13 ENCOUNTER — Ambulatory Visit: Payer: BLUE CROSS/BLUE SHIELD | Admitting: Physician Assistant

## 2016-08-13 ENCOUNTER — Emergency Department
Admission: EM | Admit: 2016-08-13 | Discharge: 2016-08-13 | Discharge status: Absent without leave | Payer: BLUE CROSS/BLUE SHIELD | Source: Home / Self Care

## 2016-08-16 ENCOUNTER — Ambulatory Visit (INDEPENDENT_AMBULATORY_CARE_PROVIDER_SITE_OTHER): Payer: BLUE CROSS/BLUE SHIELD | Admitting: Physician Assistant

## 2016-08-16 ENCOUNTER — Encounter: Payer: Self-pay | Admitting: Physician Assistant

## 2016-08-16 ENCOUNTER — Ambulatory Visit (INDEPENDENT_AMBULATORY_CARE_PROVIDER_SITE_OTHER): Payer: BLUE CROSS/BLUE SHIELD

## 2016-08-16 VITALS — BP 125/77 | HR 73 | Ht 71.0 in | Wt 212.0 lb

## 2016-08-16 DIAGNOSIS — Z720 Tobacco use: Secondary | ICD-10-CM | POA: Insufficient documentation

## 2016-08-16 DIAGNOSIS — N509 Disorder of male genital organs, unspecified: Secondary | ICD-10-CM

## 2016-08-16 DIAGNOSIS — N5089 Other specified disorders of the male genital organs: Secondary | ICD-10-CM

## 2016-08-16 DIAGNOSIS — Z7689 Persons encountering health services in other specified circumstances: Secondary | ICD-10-CM

## 2016-08-16 DIAGNOSIS — I10 Essential (primary) hypertension: Secondary | ICD-10-CM

## 2016-08-16 DIAGNOSIS — E663 Overweight: Secondary | ICD-10-CM | POA: Insufficient documentation

## 2016-08-16 DIAGNOSIS — N50819 Testicular pain, unspecified: Secondary | ICD-10-CM

## 2016-08-16 LAB — CBC
HEMATOCRIT: 49.7 % (ref 38.5–50.0)
HEMOGLOBIN: 17 g/dL (ref 13.2–17.1)
MCH: 30.6 pg (ref 27.0–33.0)
MCHC: 34.2 g/dL (ref 32.0–36.0)
MCV: 89.5 fL (ref 80.0–100.0)
MPV: 10.7 fL (ref 7.5–12.5)
Platelets: 202 10*3/uL (ref 140–400)
RBC: 5.55 MIL/uL (ref 4.20–5.80)
RDW: 12.5 % (ref 11.0–15.0)
WBC: 5.6 10*3/uL (ref 3.8–10.8)

## 2016-08-16 NOTE — Patient Instructions (Addendum)
- You will be contacted to schedule your ultrasound. It will take place here in the [facility] in our Imaging Department (first floor). As discussed, you most likely have two diagnoses (1) spermatocele and (2) varicocele  - Go downstairs for labs to check cholesterol, kidney function and blood counts. We will call you in 48 hours with results  - Schedule annual physical exam sometime in the next 6 months  Spermatocele A spermatocele is a fluid-filled sac (cyst) inside the scrotum. This type of cyst often forms at the top of the testicle where sperm is stored (epididymis). The cyst sometimes forms along the tube that carries sperm away from the epididymis (vas deferens). Spermatoceles are usually painless. Most cysts are small, but they can grow larger. Spermatoceles are not cancerous (are benign). What are the causes? The cause of this condition is not known. What are the signs or symptoms? In most cases, small cysts do not cause symptoms. If you do have symptoms, they may include:  Dull pain.  A feeling of heaviness.  An enlargement of your scrotum, if the cyst is large. How is this diagnosed? This condition is diagnosed based on a physical exam. You or your health care provider may notice the cyst when feeling your scrotum. Your provider may shine a light through (transilluminate) your scrotum to see if light will pass through the cyst. You may also have an ultrasound of your scrotum to rule out a tumor. How is this treated? Small spermatoceles do not need to be treated. If the spermatocele has grown large or is uncomfortable, surgery to remove the cyst may be recommended. Follow these instructions at home:  Watch your spermatocele for any changes.  Keep all follow-up visits as told by your health care provider. This is important. Contact a health care provider if:  Your spermatocele gets larger.  You have pain in your scrotum.  Your spermatocele comes back after treatment. This  information is not intended to replace advice given to you by your health care provider. Make sure you discuss any questions you have with your health care provider. Document Released: 07/07/2015 Document Revised: 11/10/2015 Document Reviewed: 03/30/2015 Elsevier Interactive Patient Education  2017 [employer].  Varicocele A varicocele is a swelling of veins in the scrotum. The scrotum is the sac that contains the testicles. Varicoceles can occur on either side of the scrotum, but they are more common on the left side. They occur most often in teenage boys and young men. In most cases, varicoceles are not a serious problem. They are usually small and painless and do not require treatment. Tests may be done to confirm the diagnosis. Treatment may be needed if:  A varicocele is large, causes a lot of pain, or causes pain when exercising.  Varicoceles are found on both sides of the scrotum.  The testicle on the opposite side is absent or not normal.  A varicocele causes a decrease in the size of the testicle in a growing adolescent.  The person has fertility problems. What are the causes? This condition is the result of valves in the veins not working properly. Valves in the veins help to return blood from the scrotum and testicles to the heart. If these valves do not work well, blood flows backward and backs up into the veins, which causes the veins to swell. This is similar to what happens when varicose veins form in the leg. What are the signs or symptoms? Most varicoceles do not cause any symptoms. If  symptoms do occur, they may include:  Swelling on one side of the scrotum. The swelling may be more obvious when you are standing up.  A lumpy feeling in the scrotum.  A heavy feeling on one side of the scrotum.  A dull ache in the scrotum, especially after exercise or prolonged standing or sitting.  Slower growth or reduced size of the testicle on the side of the varicocele (in young  males).  Problems with fertility. These can occur if the testicle does not grow normally. How is this diagnosed? This condition may be diagnosed with a physical exam. You may also have an imaging test, called an ultrasound, to confirm the diagnosis and to help rule out other causes of the swelling. How is this treated? Treatment is usually not needed for this condition. If you have any pain, your health care provider may prescribe or recommend medicine to help relieve it. You may need regular exams so your health care provider can monitor the varicocele to ensure that it does not cause problems. When further treatment is needed, it may involve one of these options:  Varicocelectomy. This is a surgery in which the swollen veins are tied off so that the flow of blood goes to other veins instead.  Embolization. In this procedure, a small tube (catheter) is used to place metal coils or other blocking items in the veins. This cuts off the blood flow to the swollen veins. Follow these instructions at home:  Take medicines only as directed by your health care provider.  Wear supportive underwear.  Use an athletic supporter for sports.  Keep all follow-up visits as directed by your health care provider. This is important. Contact a health care provider if:  Your pain is increasing.  You have redness in the affected area.  You have swelling that does not decrease when you are lying down.  One of your testicles is smaller than the other.  Your testicle becomes enlarged, swollen, or painful. This information is not intended to replace advice given to you by your health care provider. Make sure you discuss any questions you have with your health care provider. Document Released: 06/21/2000 Document Revised: 08/27/2015 Document Reviewed: 02/20/2014 Elsevier Interactive Patient Education  2017 [employer]

## 2016-08-16 NOTE — Progress Notes (Signed)
HPI:                                                                Nicolas Warren is a 31 y.o. male who presents to [facility] Medicine today to establish care   Current Concerns include "right groin pain"  Patient reports right-sided scrotal pain and a palpable firm lump on the posterior aspect of his right scrotum/testicle x 6 days. Patient states pain has been moderate and waxing and waning. Denies any known injury or trauma. Reports he does perform heavy lifting daily for work.   Health Maintenance Health Maintenance  Topic Date Due  . HIV Screening  04/01/2002  . TETANUS/TDAP  08/16/2017 (Originally 04/01/2006)  . INFLUENZA VACCINE  10/27/2016    Past Medical History:  Diagnosis Date  . HTN (hypertension) 08/15/2012   Past Surgical History:  Procedure Laterality Date  . APPENDECTOMY    . ORBITAL RECONSTRUCTION    . SHOULDER ARTHROSCOPY     Social History  Substance Use Topics  . Smoking status: Current Some Day Smoker    Types: Cigarettes  . Smokeless tobacco: Never Used     Comment: 4 cigarettes  . Alcohol use 1.8 oz/week    3 Cans of beer per week   family history includes Alcohol abuse in his father; Depression in his mother; Heart disease in his maternal grandfather; Heart failure in his father; Hyperlipidemia in his father; Hypertension in his father; Lymphoma in his maternal grandfather; Stroke in his father; Thyroid cancer in his mother.  ROS: negative except as noted in the HPI  Medications: No current outpatient prescriptions on file.   No current facility-administered medications for this visit.    No Known Allergies   Objective:  BP 125/77   Pulse 73   Ht 5\' 11"  (1.803 m)   Wt 212 lb (96.2 kg)   BMI 29.57 kg/m  Gen: well-groomed, cooperative, not ill-appearing, no distress Pulm: Normal work of breathing, normal phonation, clear to auscultation bilaterally CV: Normal rate, regular rhythm, s1 and  s2 distinct, no murmurs, clicks or rubs  GU: penis without rashes or lesions, normal appearing scrotum, palpable varicoceles bilaterally worse on the right, palpable nodule approx.1 cm nodule on posterior aspect of the right testicle , no inguinal hernias Neuro: alert and oriented x 3, EOM's intact MSK: moving all extremities, normal gait and station, no peripheral edema Psych: good eye contact, normal affect, euthymic mood, normal speech and thought content  A chaperone was used for the GU portion of the exam (Dr. Rodney Langton)  No results found for this or any previous visit (from the past 72 hour(s)). No results found.  Assessment and Plan: 31 y.o. male with   Encounter to establish care  Essential hypertension - BP in range on recheck today - continue to manage with lifestyle. DASH eating plan. He has cut back on smoking from 1ppd to a few cigarettes per week - CBC - Comprehensive metabolic panel - Lipid Panel w/reflex Direct LDL  Testicular discomfort, Testicular mass - based on exam, most likely varicocele and spermatocele. Will obtain ultrasound to r/o malignancy - US Scrotum; Future - [place] Art/Ven Flow Abd Pelv Doppler - patient counseled to limit lifting to 20 pounds  Patient education and anticipatory guidance given Patient agrees with treatment plan Follow-up in 6 months for CPE or sooner as needed  Levonne Hubertharley E. Cummings PA-C

## 2016-08-17 LAB — COMPREHENSIVE METABOLIC PANEL
ALBUMIN: 4.9 g/dL (ref 3.6–5.1)
ALK PHOS: 47 U/L (ref 40–115)
ALT: 30 U/L (ref 9–46)
AST: 15 U/L (ref 10–40)
BILIRUBIN TOTAL: 0.7 mg/dL (ref 0.2–1.2)
BUN: 14 mg/dL (ref 7–25)
CALCIUM: 9.8 mg/dL (ref 8.6–10.3)
CO2: 24 mmol/L (ref 20–31)
Chloride: 104 mmol/L (ref 98–110)
Creat: 1.16 mg/dL (ref 0.60–1.35)
Glucose, Bld: 90 mg/dL (ref 65–99)
Potassium: 4.6 mmol/L (ref 3.5–5.3)
Sodium: 138 mmol/L (ref 135–146)
Total Protein: 7.3 g/dL (ref 6.1–8.1)

## 2016-08-17 LAB — LIPID PANEL W/REFLEX DIRECT LDL
CHOLESTEROL: 198 mg/dL (ref <200)
HDL: 49 mg/dL (ref >40)
LDL-Cholesterol: 121 mg/dL — ABNORMAL HIGH
Non-HDL Cholesterol (Calc): 149 mg/dL — ABNORMAL HIGH (ref <130)
TRIGLYCERIDES: 164 mg/dL — AB (ref <150)
Total CHOL/HDL Ratio: 4 Ratio (ref <5.0)

## 2016-10-21 ENCOUNTER — Ambulatory Visit (INDEPENDENT_AMBULATORY_CARE_PROVIDER_SITE_OTHER): Payer: BLUE CROSS/BLUE SHIELD | Admitting: Sports Medicine

## 2016-10-21 ENCOUNTER — Encounter: Payer: Self-pay | Admitting: Sports Medicine

## 2016-10-21 ENCOUNTER — Ambulatory Visit (INDEPENDENT_AMBULATORY_CARE_PROVIDER_SITE_OTHER): Payer: BLUE CROSS/BLUE SHIELD

## 2016-10-21 DIAGNOSIS — M25531 Pain in right wrist: Secondary | ICD-10-CM

## 2016-10-21 DIAGNOSIS — K120 Recurrent oral aphthae: Secondary | ICD-10-CM | POA: Insufficient documentation

## 2016-10-21 MED ORDER — MELOXICAM 15 MG PO TABS: ORAL_TABLET | ORAL | 3 refills | Status: DC

## 2016-10-21 MED ORDER — TRIAMCINOLONE ACETONIDE 0.1 % MT PSTE: 1.0000 "application " | PASTE | Freq: Two times a day (BID) | OROMUCOSAL | 11 refills | Status: DC

## 2016-10-21 NOTE — Patient Instructions (Signed)
Canker Sores Canker sores are small, painful sores that develop inside your mouth. They may also be called aphthous ulcers. You can get canker sores on the inside of your lips or cheeks, on your tongue, or anywhere inside your mouth. You can have just one canker sore or several of them. Canker sores cannot be passed from one person to another (noncontagious). These sores are different than the sores that you may get on the outside of your lips (cold sores or fever blisters). Canker sores usually start as painful red bumps. Then they turn into small white, yellow, or gray ulcers that have red borders. The ulcers may be quite painful. The pain may be worse when you eat or drink. What are the causes? The cause of this condition is not known. What increases the risk? This condition is more likely to develop in:  Women.  People in their teens or 20s.  Women who are having their menstrual period.  People who are under a lot of emotional stress.  People who do not get enough iron or B vitamins.  People who have poor oral hygiene.  People who have an injury inside the mouth. This can happen after having dental work or from chewing something hard.  What are the signs or symptoms? Along with the canker sore, symptoms may also include:  Fever.  Fatigue.  Swollen lymph nodes in your neck.  How is this diagnosed? This condition can be diagnosed based on your symptoms. Your health care provider will also examine your mouth. Your health care provider may also do tests if you get canker sores often or if they are very bad. Tests may include:  Blood tests to rule out other causes of canker sores.  Taking swabs from the sore to check for infection.  Taking a small piece of skin from the sore (biopsy) to test it for cancer.  How is this treated? Most canker sores clear up without treatment in about 10 days. Home care is usually the only treatment that you will need. Over-the-counter medicines  can relieve discomfort.If you have severe canker sores, your health care provider may prescribe:  Numbing ointment to relieve pain.  Vitamins.  Steroid medicines. These may be given as: ? Oral pills. ? Mouth rinses. ? Gels.  Antibiotic mouth rinse.  Follow these instructions at home:  Apply, take, or use medicines only as directed by your health care provider. These include vitamins.  If you were prescribed an antibiotic mouth rinse, finish all of it even if you start to feel better.  Until the sores are healed: ? Do not drink coffee or citrus juices. ? Do not eat spicy or salty foods.  Use a mild, over-the-counter mouth rinse as directed by your health care provider.  Practice good oral hygiene. ? Floss your teeth every day. ? Brush your teeth with a soft brush twice each day. Contact a health care provider if:  Your symptoms do not get better after two weeks.  You also have a fever or swollen glands.  You get canker sores often.  You have a canker sore that is getting larger.  You cannot eat or drink due to your canker sores. This information is not intended to replace advice given to you by your health care provider. Make sure you discuss any questions you have with your health care provider. Document Released: 07/10/2010 Document Revised: 08/21/2015 Document Reviewed: 02/13/2014 Elsevier Interactive Patient Education  2018 Elsevier Inc.  

## 2016-10-21 NOTE — Assessment & Plan Note (Signed)
Present for over 8 months in spite of physician directed conservative measures including injections by another provider, rehabilitation. 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 At this point he does need an MRI arthrogram, I'm going to go ahead and get x-rays today, start meloxicam, and give him additional rehabilitation exercises, I will see him back for the arthrogram injection.

## 2016-10-21 NOTE — Assessment & Plan Note (Signed)
Adding triamcinolone dental paste, cautioned against dipping.

## 2016-10-21 NOTE — Progress Notes (Signed)
   Subjective:    I'm seeing this patient as a consultation for:  Gena Frayharley Cummings, PA-C  CC: Oral ulcer, right wrist pain  HPI: For the past several days this 31 year old male has had a painful ulcer just lateral to his right upper molars.  Right wrist pain: Present for months, he's had a steroid injection in the distant past that provided temporary relief by another provider, has really not had any diagnostics, pain is on the ulnar aspect of the wrist, moderate, persistent, worse with heavy use, he works as a Scientist, research (life sciences)telephone lineman.  Past medical history, Surgical history, Family history not pertinant except as noted below, Social history, Allergies, and medications have been entered into the medical record, reviewed, and no changes needed.   Review of Systems: No headache, visual changes, nausea, vomiting, diarrhea, constipation, dizziness, abdominal pain, skin rash, fevers, chills, night sweats, weight loss, swollen lymph nodes, body aches, joint swelling, muscle aches, chest pain, shortness of breath, mood changes, visual or auditory hallucinations.   Objective:   General: Well Developed, well nourished, and in no acute distress.  Neuro:  Extra-ocular muscles intact, able to move all 4 extremities, sensation grossly intact.  Deep tendon reflexes tested were normal. Psych: Alert and oriented, mood congruent with affect. ENT:  Ears and nose appear unremarkable.  Hearing grossly normal.Oropharynx shows a shallow based 1 cm aphthous ulcer just lateral to the second right maxillary molar Neck: Unremarkable overall appearance, trachea midline.  No visible thyroid enlargement. Eyes: Conjunctivae and lids appear unremarkable.  Pupils equal and round. Skin: Warm and dry, no rashes noted.  Cardiovascular: Pulses palpable, no extremity edema. Right Wrist: Inspection normal with no visible erythema or swelling. ROM smooth and normal with good flexion and extension and ulnar/radial deviation that is  symmetrical with opposite wrist. Palpation is normal over metacarpals, navicular, lunate, and TFCC; tendons without tenderness/ swelling No snuffbox tenderness. No tenderness over Canal of Guyon. Strength 5/5 in all directions without pain. Negative tinel's and phalens signs. Negative Finkelstein sign. Negative Watson's test.  X-rays personally reviewed and are negative.  Impression and Recommendations:   This case required medical decision making of moderate complexity.  Right wrist pain Present for over 8 months in spite of physician directed conservative measures including injections by another provider, rehabilitation. 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 At this point he does need an MRI arthrogram, I'm going to go ahead and get x-rays today, start meloxicam, and give him additional rehabilitation exercises, I will see him back for the arthrogram injection.   Aphthous ulcer Adding triamcinolone dental paste, cautioned against dipping.

## 2017-05-10 ENCOUNTER — Encounter: Payer: Self-pay | Admitting: Physician Assistant

## 2017-05-10 ENCOUNTER — Ambulatory Visit: Payer: BLUE CROSS/BLUE SHIELD | Admitting: Physician Assistant

## 2017-05-10 VITALS — BP 123/80 | HR 69 | Temp 98.1°F | Wt 193.0 lb

## 2017-05-10 DIAGNOSIS — F17220 Nicotine dependence, chewing tobacco, uncomplicated: Secondary | ICD-10-CM

## 2017-05-10 DIAGNOSIS — R062 Wheezing: Secondary | ICD-10-CM

## 2017-05-10 DIAGNOSIS — M542 Cervicalgia: Secondary | ICD-10-CM | POA: Diagnosis not present

## 2017-05-10 MED ORDER — NICOTINE POLACRILEX 4 MG MT GUM: 4.0000 mg | CHEWING_GUM | OROMUCOSAL | 0 refills | Status: DC | PRN

## 2017-05-10 MED ORDER — NICOTINE 21 MG/24HR TD PT24: 21.0000 mg | MEDICATED_PATCH | Freq: Every day | TRANSDERMAL | 1 refills | Status: DC

## 2017-05-10 NOTE — Patient Instructions (Addendum)
For your neck pain:  - watchful waiting - if symptoms worsen or persist after 2 weeks, let's talk about getting a CT scan of your neck  For chewing tobacco: - 1 can is equivalent to 80 cigarettes per day - pick a quit date - get your ducks in a row: sign up for a support program (1800QuitNow, Nicoderm, etc.) - start the patch daily - use the gum as needed in addition to the patch - the important thing is that you don't dip once your start nicotine replacement   Coping with Quitting Quitting smoking is a physical and mental challenge. You will face cravings, withdrawal symptoms, and temptation. Before quitting, work with your health care provider to make a plan that can help you cope. Preparation can help you quit and keep you from giving in. How can I cope with cravings? Cravings usually last for 5-10 minutes. If you get through it, the craving will pass. Consider taking the following actions to help you cope with cravings:  Keep your mouth busy: ? Chew sugar-free gum. ? Suck on hard candies or a straw. ? Brush your teeth.  Keep your hands and body busy: ? Immediately change to a different activity when you feel a craving. ? Squeeze or play with a ball. ? Do an activity or a hobby, like making bead jewelry, practicing needlepoint, or working with wood. ? Mix up your normal routine. ? Take a short exercise break. Go for a quick walk or run up and down stairs. ? Spend time in public places where smoking is not allowed.  Focus on doing something kind or helpful for someone else.  Call a friend or family member to talk during a craving.  Join a support group.  Call a quit line, such as 1-800-QUIT-NOW.  Talk with your health care provider about medicines that might help you cope with cravings and make quitting easier for you.  How can I deal with withdrawal symptoms? Your body may experience negative effects as it tries to get used to not having nicotine in the system. These  effects are called withdrawal symptoms. They may include:  Feeling hungrier than normal.  Trouble concentrating.  Irritability.  Trouble sleeping.  Feeling depressed.  Restlessness and agitation.  Craving a cigarette.  To manage withdrawal symptoms:  Avoid places, people, and activities that trigger your cravings.  Remember why you want to quit.  Get plenty of sleep.  Avoid coffee and other caffeinated drinks. These may worsen some of your symptoms.  How can I handle social situations? Social situations can be difficult when you are quitting smoking, especially in the first few weeks. To manage this, you can:  Avoid parties, bars, and other social situations where people might be smoking.  Avoid alcohol.  Leave right away if you have the urge to smoke.  Explain to your family and friends that you are quitting smoking. Ask for understanding and support.  Plan activities with friends or family where smoking is not an option.  What are some ways I can cope with stress? Wanting to smoke may cause stress, and stress can make you want to smoke. Find ways to manage your stress. Relaxation techniques can help. For example:  Breathe slowly and deeply, in through your nose and out through your mouth.  Listen to soothing, relaxing music.  Talk with a family member or friend about your stress.  Light a candle.  Soak in a bath or take a shower.  Think about a  peaceful place.  What are some ways I can prevent weight gain? Be aware that many people gain weight after they quit smoking. However, not everyone does. To keep from gaining weight, have a plan in place before you quit and stick to the plan after you quit. Your plan should include:  Having healthy snacks. When you have a craving, it may help to: ? Eat plain popcorn, crunchy carrots, celery, or other cut vegetables. ? Chew sugar-free gum.  Changing how you eat: ? Eat small portion sizes at meals. ? Eat 4-6 small  meals throughout the day instead of 1-2 large meals a day. ? Be mindful when you eat. Do not watch television or do other things that might distract you as you eat.  Exercising regularly: ? Make time to exercise each day. If you do not have time for a long workout, do short bouts of exercise for 5-10 minutes several times a day. ? Do some form of strengthening exercise, like weight lifting, and some form of aerobic exercise, like running or swimming.  Drinking plenty of water or other low-calorie or no-calorie drinks. Drink 6-8 glasses of water daily, or as much as instructed by your health care provider.  Summary  Quitting smoking is a physical and mental challenge. You will face cravings, withdrawal symptoms, and temptation to smoke again. Preparation can help you as you go through these challenges.  You can cope with cravings by keeping your mouth busy (such as by chewing gum), keeping your body and hands busy, and making calls to family, friends, or a helpline for people who want to quit smoking.  You can cope with withdrawal symptoms by avoiding places where people smoke, avoiding drinks with caffeine, and getting plenty of rest.  Ask your health care provider about the different ways to prevent weight gain, avoid stress, and handle social situations. This information is not intended to replace advice given to you by your health care provider. Make sure you discuss any questions you have with your health care provider. Document Released: 03/12/2016 Document Revised: 03/12/2016 Document Reviewed: 03/12/2016 Elsevier Interactive Patient Education  [employer]2018 Elsevier Inc.

## 2017-05-10 NOTE — Progress Notes (Signed)
HPI:                                                                Nicolas Warren is a 32 y.o. male who [facility] today for neck pain  Patient reports right-sided anterior neck pain present for about 5-7 days. Pain is deep to the tonsillar lymph node region. Denies fever, chills, pharyngitis, dysphagia, otaglia, rhinitis, swollen lymph nodes or cough. Reports he currently dips about 1 can per day and smokes about 1-5 cigarettes per week. He is interested in quitting. He has used nicotine gum in the past for smoking cessation. Otherwise, no prior quit attempts.  No flowsheet data found.  No flowsheet data found.    Past Medical History:  Diagnosis Date  . HTN (hypertension) 08/15/2012   Past Surgical History:  Procedure Laterality Date  . APPENDECTOMY    . ORBITAL RECONSTRUCTION    . SHOULDER ARTHROSCOPY     Social History   Tobacco Use  . Smoking status: Current Some Day Smoker    Types: Cigarettes  . Smokeless tobacco: Current User    Types: Chew  . Tobacco comment: 4 cigarettes  Substance Use Topics  . Alcohol use: Yes    Alcohol/week: 1.8 oz    Types: 3 Cans of beer per week   family history includes Alcohol abuse in his father; Depression in his mother; Heart disease in his maternal grandfather; Heart failure in his father; Hyperlipidemia in his father; Hypertension in his father; Lymphoma in his maternal grandfather; Stroke in his father; Thyroid cancer in his mother.    ROS: negative except as noted in the HPI  Medications: No current outpatient medications on file.   No current facility-administered medications for this visit.    No Known Allergies     Objective:  BP 123/80   Pulse 69   Temp 98.1 F (36.7 C) (Oral)   Wt 193 lb (87.5 kg)   BMI 26.92 kg/m  Gen:  alert, not ill-appearing, no distress, appropriate for age HEENT: head normocephalic without obvious abnormality,  conjunctiva and cornea clear, TM's clear bilaterally, oral mucosa without lesions, there is some scarring of the right lower buccal mucosa, oropharynx clear without erythema or edema, moist mucous membranes, neck supple, no cervical, submandibular or submental adenopathy, tenderness noted in the region of the right midjugular chain, neck supple, full active ROM, trachea midline Pulm: Normal work of breathing, normal phonation, expiratory wheeze on the left about halfway up CV: Normal rate, regular rhythm, s1 and s2 distinct, no murmurs, clicks or rubs  Neuro: alert and oriented x 3, no tremor MSK: extremities atraumatic, normal gait and station Skin: intact, no rashes on exposed skin, no jaundice, no cyanosis   No results found for this or any previous visit (from the past 72 hour(s)). No results found.    Assessment and Plan: 32 y.o. male with   1. Anterior neck pain - differential includes broad and includes reactive lymphadenitis from oncoming infection, tendinopathy/neck muscle strain, carotid disease, mass. - no palpable masses or abnormality on exam today - we discussed active surveillance for 2 weeks. If symptoms persist or recur, will consider advanced imaging to r/o mass given his heavy tobacco use  2.  Chewing tobacco nicotine dependence without complication - cessation materials reviewed today. Due to his large nicotine consumption he will need the highest strength patch as well as gum prn - nicotine (NICODERM CQ) 21 mg/24hr patch; Place 1 patch (21 mg total) onto the skin daily.  Dispense: 28 patch; Refill: 1 - nicotine polacrilex (NICORETTE) 4 MG gum; Take 1 each (4 mg total) by mouth as needed (smokeless tobacco cessation).  Dispense: 100 tablet; Refill: 0  3. Wheezing on left side of chest on exhalation - denies dyspnea, cough or consitutional symptoms. Declines prescription for Albuterol MDI - counseled on avoiding cigarettes and secondhand smoke   Patient education and  anticipatory guidance given Patient agrees with treatment plan Follow-up in 4 weeks for tobacco cessation as needed if symptoms worsen or fail to improve  Levonne Hubert PA-C

## 2017-05-27 ENCOUNTER — Encounter: Payer: Self-pay | Admitting: Physician Assistant

## 2017-05-27 ENCOUNTER — Ambulatory Visit: Payer: BLUE CROSS/BLUE SHIELD | Admitting: Physician Assistant

## 2017-05-27 VITALS — BP 130/84 | HR 57 | Wt 181.0 lb

## 2017-05-27 DIAGNOSIS — L918 Other hypertrophic disorders of the skin: Secondary | ICD-10-CM | POA: Diagnosis not present

## 2017-05-27 NOTE — Progress Notes (Signed)
HPI:                                                                Nicolas Warren is a 32 y.o. male who [facility] today for skin tag removal  Multiple, irritated skin tags of the lateral lower neck bilaterally present for months. Has had skin tags removed from axilla and neck in the past. Denies hx of skin cancer.   No flowsheet data found.  No flowsheet data found.    Past Medical History:  Diagnosis Date  . HTN (hypertension) 08/15/2012   Past Surgical History:  Procedure Laterality Date  . APPENDECTOMY    . ORBITAL RECONSTRUCTION    . SHOULDER ARTHROSCOPY     Social History   Tobacco Use  . Smoking status: Current Some Day Smoker    Types: Cigarettes  . Smokeless tobacco: Current User    Types: Chew  . Tobacco comment: 1-5 per week  Substance Use Topics  . Alcohol use: Yes    Alcohol/week: 1.8 oz    Types: 3 Cans of beer per week   family history includes Alcohol abuse in his father; Depression in his mother; Heart disease in his maternal grandfather; Heart failure in his father; Hyperlipidemia in his father; Hypertension in his father; Lymphoma in his maternal grandfather; Stroke in his father; Thyroid cancer in his mother.    ROS: negative except as noted in the HPI  Medications: No current outpatient medications on file.   No current facility-administered medications for this visit.    No Known Allergies     Objective:  BP 130/84   Pulse (!) 57   Wt 181 lb (82.1 kg)   BMI 25.24 kg/m  Gen:  alert, not ill-appearing, no distress, appropriate for age HEENT: head normocephalic without obvious abnormality, conjunctiva and cornea clear, trachea midline Pulm: Normal work of breathing, normal phonation Neuro: alert and oriented x 3, no tremor MSK: extremities atraumatic, normal gait and station Skin: multiple benign appearing flesh-colored skin tags, 1 on the right lateral upper  eyelid, 3 on the left lateral lower neck and 4 on the right lateral lower neck Psych: well-groomed, cooperative, good eye contact, euthymic mood, affect mood-congruent, speech is articulate, and thought processes clear and goal-directed  Procedure: Skin tags are snipped off using alcohol for cleansing and sterile iris scissors. Hemostasis achieved with light pressure. These pathognomonic lesions are not sent for pathology. Cryotherapy Cryodestruction of skin tag or right upper eyelid Noted no overlying erythema, induration, or other signs of local infection. Skin held upward under tension to avoid contact with globe. Completed without difficulty using Cryo-Gun.  No results found for this or any previous visit (from the past 72 hour(s)). No results found.    Assessment and Plan: 32 y.o. male with   1. Skin tags, multiple acquired - benign appearance, not sent for pathology   Patient education and anticipatory guidance given Patient agrees with treatment plan Follow-up as needed if symptoms worsen or fail to improve  Levonne Hubertharley E. Amir Glaus PA-C

## 2017-05-29 ENCOUNTER — Encounter: Payer: Self-pay | Admitting: Physician Assistant

## 2017-05-29 DIAGNOSIS — L918 Other hypertrophic disorders of the skin: Secondary | ICD-10-CM | POA: Insufficient documentation

## 2018-03-13 IMAGING — DX DG WRIST COMPLETE 3+V*R*
4 series · 4 of 4 positions shown · non-contrast
Comparison: None available

CLINICAL DATA: Right ulnar wrist pain

EXAM:
RIGHT WRIST - COMPLETE 3+ VIEW

[wrist pa]
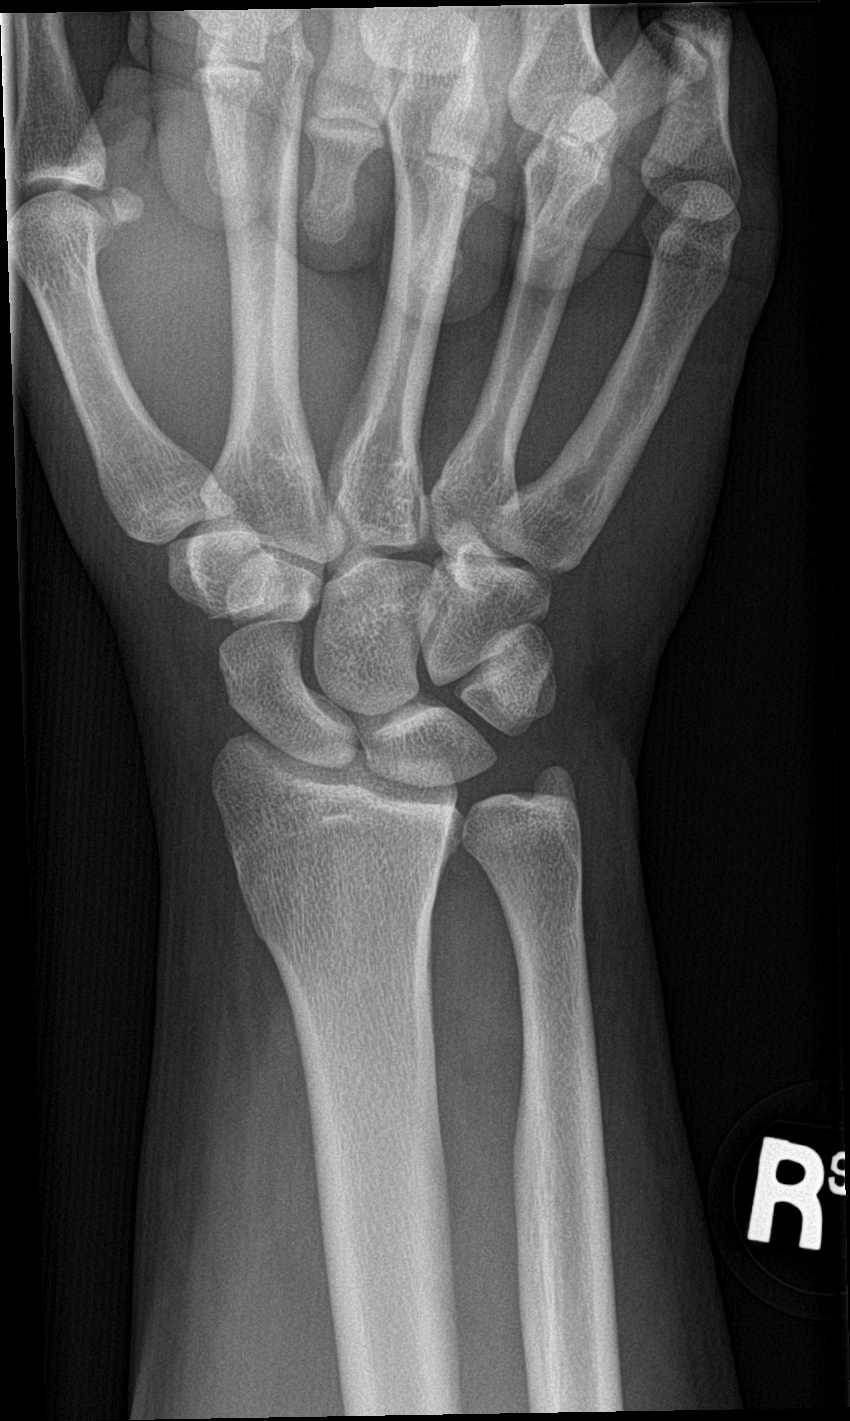

[wrist obl]
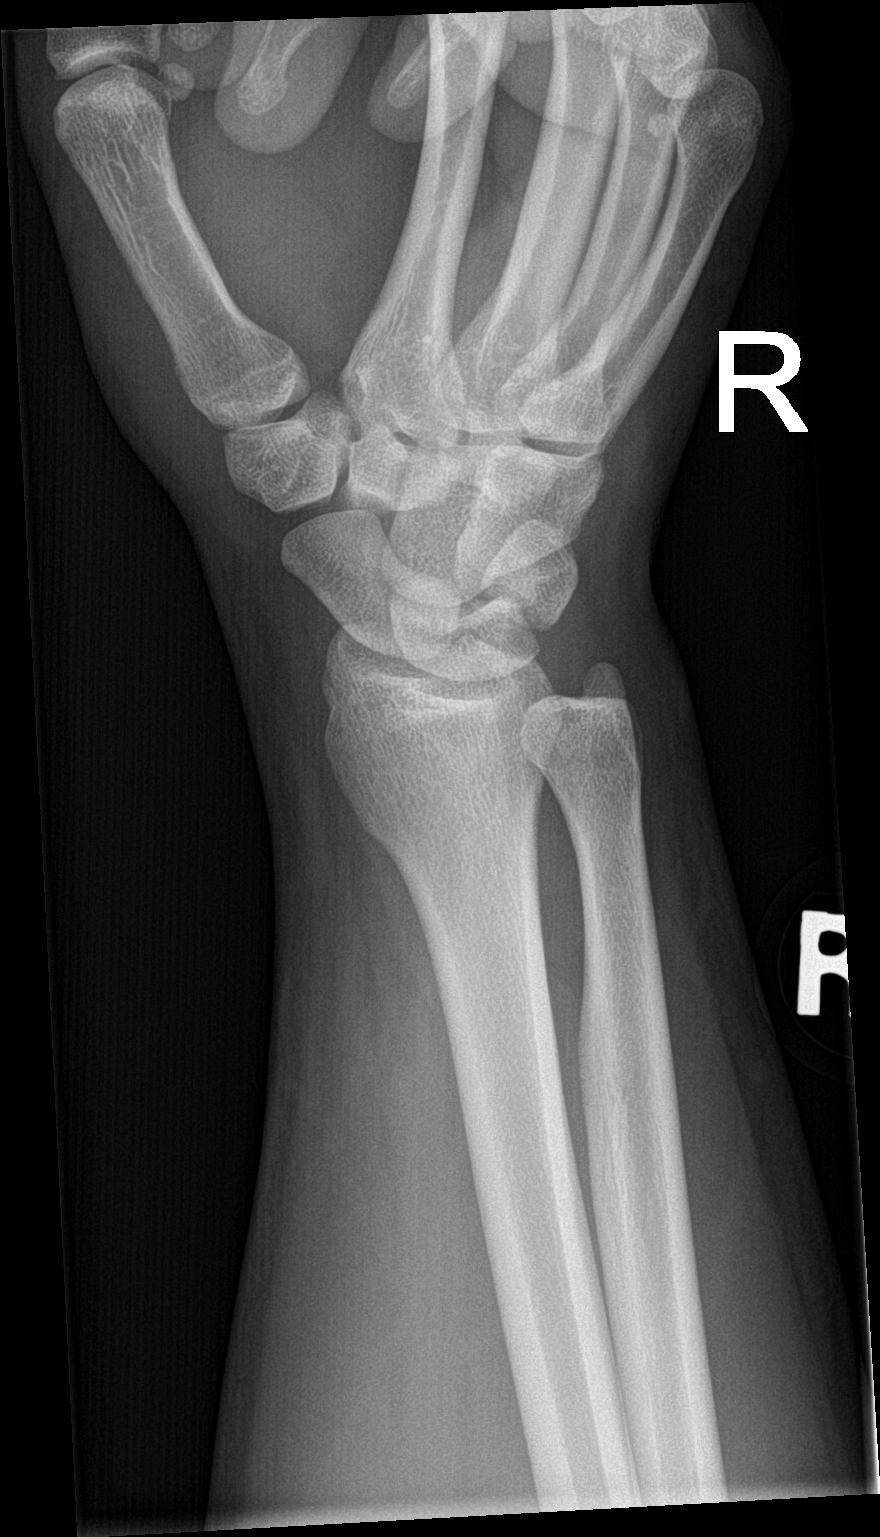

[wrist lat]
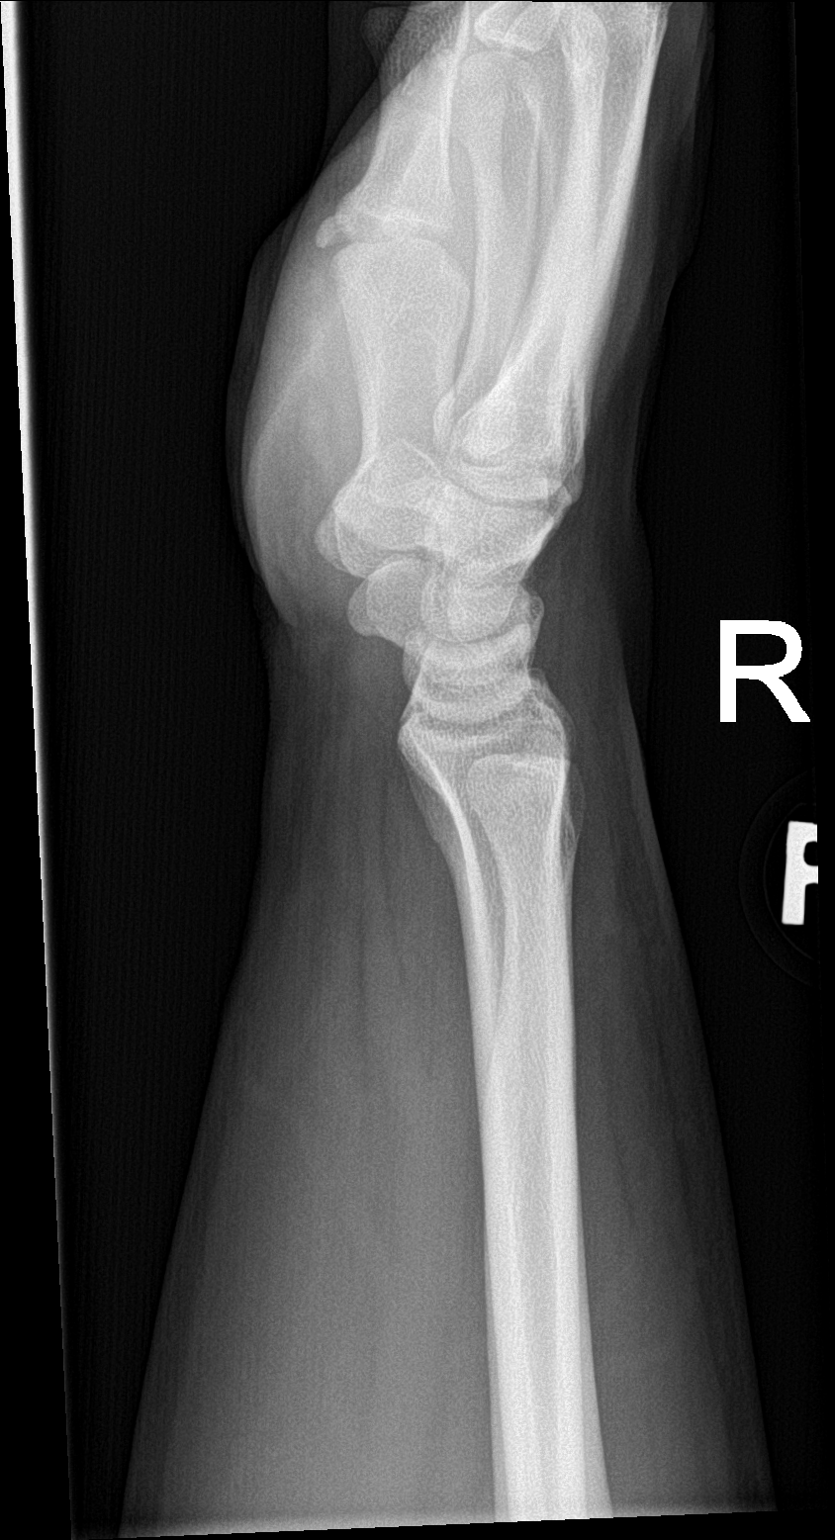

[wrist navicular]
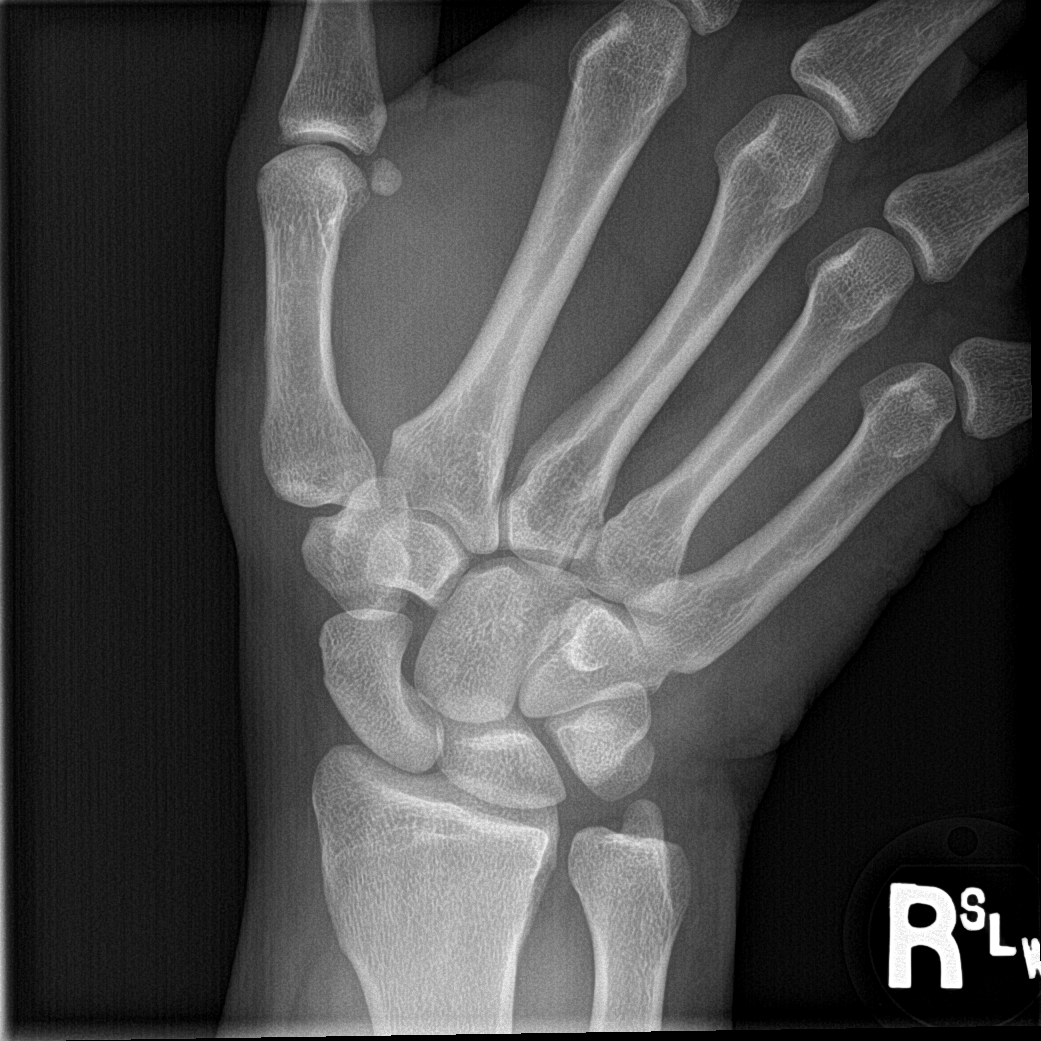

[4 of 4 positions shown; findings below may reference images not displayed]

FINDINGS: There is no evidence of fracture or dislocation. There is no
evidence of arthropathy or other focal bone abnormality. Soft
tissues are unremarkable.
IMPRESSION: No acute osseous finding.

## 2020-02-13 ENCOUNTER — Telehealth: Payer: Self-pay

## 2020-02-13 NOTE — Telephone Encounter (Signed)
Patient called in requesting a new patient appointment. He has never been seen by cardiology and has not established with a PCP yet, therefore does not have any records to provide. He states that he is scheduled to see a new PCP, Nicolas Warren, in December. He states that he has been having intermittent palpitations/fluttering in his chest for the past month. He states that both of his parents see Nicolas Warren and have a history of CAD and he would like to see Nicolas Warren if possible. I have scheduled him to see Nicolas Warren on 11/29 at 2:40 PM. The patient states that he was seen at the [facility] and had an EKG and CXR in October of this year. I will forward this information to our chart prep department.

## 2020-02-25 ENCOUNTER — Other Ambulatory Visit: Payer: Self-pay

## 2020-02-25 ENCOUNTER — Encounter: Payer: Self-pay | Admitting: Cardiovascular Disease

## 2020-02-25 ENCOUNTER — Ambulatory Visit (INDEPENDENT_AMBULATORY_CARE_PROVIDER_SITE_OTHER): Payer: 59 | Admitting: Cardiovascular Disease

## 2020-02-25 VITALS — BP 142/84 | HR 70 | Ht 71.0 in | Wt 207.0 lb

## 2020-02-25 DIAGNOSIS — R002 Palpitations: Secondary | ICD-10-CM

## 2020-02-25 NOTE — Patient Instructions (Signed)
Medication Instructions:  Your physician recommends that you continue on your current medications as directed. Please refer to the Current Medication list given to you today.  *If you need a refill on your cardiac medications before your next appointment, please call your pharmacy*   Testing/Procedures: Your physician has requested that you have an echocardiogram. Echocardiography is a painless test that uses sound waves to create images of your heart. It provides your doctor with information about the size and shape of your heart and how well your hearts chambers and valves are working. This procedure takes approximately one hour. There are no restrictions for this procedure.  Your physician has recommended that you wear an event monitor. Event monitors are medical devices that record the hearts electrical activity. Doctors most often [place] these monitors to diagnose arrhythmias. Arrhythmias are problems with the speed or rhythm of the heartbeat. The monitor is a small, portable device. You can wear one while you do your normal daily activities. This is usually used to diagnose what is causing palpitations/syncope (passing out).  Follow-Up: At [facility], you and your health needs are our priority.  As part of our continuing mission to provide you with exceptional heart care, we have created designated Provider Care Teams.  These Care Teams include your primary Cardiologist (physician) and Advanced Practice Providers (APPs -  Physician Assistants and Nurse Practitioners) who all work together to provide you with the care you need, when you need it.  We recommend signing up for the patient portal called "MyChart".  Sign up information is provided on this After Visit Summary.  MyChart is used to connect with patients for Virtual Visits (Telemedicine).  Patients are able to view lab/test results, encounter notes, upcoming appointments, etc.  Non-urgent messages can be sent to your provider as well.    To learn more about what you can do with MyChart, go to [email or web address].    Your next appointment:   1 year(s)  The format for your next appointment:   In Person  Provider:   You may see Verne Carrow, MD or one of the following Advanced Practice Providers on your designated Care Team:    Ronie Spies, PA-C  Jacolyn Reedy, PA-C  Other Instructions Christena Deem- Long Term Monitor Instructions   Your physician has requested you wear your ZIO patch monitor_14_days.   This is a single patch monitor.  [employer] supplies one patch monitor per enrollment.  Additional stickers are not available.   Please do not apply patch if you will be having a Nuclear Stress Test, Echocardiogram, Cardiac CT, MRI, or Chest Xray during the time frame you would be wearing the monitor. The patch cannot be worn during these tests.  You cannot remove and re-apply the ZIO XT patch monitor.   Your ZIO patch monitor will be sent USPS Priority mail from [facility] directly to your home address. The monitor may also be mailed to a PO BOX if home delivery is not available.   It may take 3-5 days to receive your monitor after you have been enrolled.   Once you have received you monitor, please review enclosed instructions.  Your monitor has already been registered assigning a specific monitor serial # to you.   Applying the monitor   Shave hair from upper left chest.   Hold abrader disc by orange tab.  Rub abrader in 40 strokes over left upper chest as indicated in your monitor instructions.   Clean area with 4 enclosed alcohol pads .  Use all pads to assure are is cleaned thoroughly.  Let dry.   Apply patch as indicated in monitor instructions.  Patch will be place under collarbone on left side of chest with arrow pointing upward.   Rub patch adhesive wings for 2 minutes.Remove white label marked "1".  Remove white label marked "2".  Rub patch adhesive wings for 2 additional minutes.   While  looking in a mirror, press and release button in center of patch.  A small green light will flash 3-4 times .  This will be your only indicator the monitor has been turned on.     Do not shower for the first 24 hours.  You may shower after the first 24 hours.   Press button if you feel a symptom. You will hear a small click.  Record Date, Time and Symptom in the Patient Log Book.   When you are ready to remove patch, follow instructions on last 2 pages of Patient Log Book.  Stick patch monitor onto last page of Patient Log Book.   Place Patient Log Book in [place] box.  Use locking tab on box and tape box closed securely.  The [employer] has [employer] on it.  Please place in mailbox as soon as possible.  Your physician should have your test results approximately 7 days after the monitor has been mailed back to [facility].   Call [facility] Customer Care at [phone] if you have questions regarding your ZIO XT patch monitor.  Call them immediately if you see an orange light blinking on your monitor.   If your monitor falls off in less than 4 days contact our Monitor department at [phone].  If your monitor becomes loose or falls off after 4 days call [employer] at [phone] for suggestions on securing your monitor.

## 2020-02-25 NOTE — Progress Notes (Signed)
Chief Complaint  Patient presents with  . New Patient (Initial Visit)    palpitations   History of Present Illness: 34 yo male with no past medical history who is here today as a new patient for the evaluation of palpitations. EKG in 2015 reviewed today and showed sinus with incomplete RBBB. He tells me that back in September 2021 he had the onset of right sided chest pain and palpitations. The episodes would last for several seconds. It felt like his heart was fluttering in his chest. No exertional chest pain. Workup at [facility] system in included normal chest x-ray, normal TSH, electrolytes, troponin. 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 No dyspnea, dizziness, near syncope. He does use smokeless tobacco.   Primary Care Physician: Ulice Bold, MD  History reviewed. No pertinent past medical history.  No chronic medical problems  Past Surgical History:  Procedure Laterality Date  . APPENDECTOMY    . ORBITAL RECONSTRUCTION    . SHOULDER ARTHROSCOPY      No current outpatient medications on file.   No current facility-administered medications for this visit.    No Known Allergies  Social History   Socioeconomic History  . Marital status: Significant Other    Spouse name: Not on file  . Number of children: 0  . Years of education: Not on file  . Highest education level: Not on file  Occupational History  . Occupation: Lineman  Tobacco Use  . Smoking status: Former Smoker    Types: Cigarettes  . Smokeless tobacco: Current User    Types: Chew  . Tobacco comment: 1-5 per week  Substance and Sexual Activity  . Alcohol use: Yes    Alcohol/week: 3.0 standard drinks    Types: 3 Cans of beer per week  . Drug use: No  . Sexual activity: Yes    Birth control/protection: Pill  Other Topics Concern  . Not on file  Social History Narrative  . Not on file   Social Determinants of Health   Financial Resource Strain:   . Difficulty of  Paying Living Expenses: Not on file  Food Insecurity:   . Worried About Programme researcher, broadcasting/film/video in the Last Year: Not on file  . Ran Out of Food in the Last Year: Not on file  Transportation Needs:   . Lack of Transportation (Medical): Not on file  . Lack of Transportation (Non-Medical): Not on file  Physical Activity:   . Days of Exercise per Week: Not on file  . Minutes of Exercise per Session: Not on file  Stress:   . Feeling of Stress : Not on file  Social Connections:   . Frequency of Communication with Friends and Family: Not on file  . Frequency of Social Gatherings with Friends and Family: Not on file  . Attends Religious Services: Not on file  . Active Member of Clubs or Organizations: Not on file  . Attends Banker Meetings: Not on file  . Marital Status: Not on file  Intimate Partner Violence:   . Fear of Current or Ex-Partner: Not on file  . Emotionally Abused: Not on file  . Physically Abused: Not on file  . Sexually Abused: Not on file    Family History  Problem Relation Age of Onset  . Depression Mother   . Thyroid cancer Mother   . CAD Mother   . Heart failure Father   . Alcohol abuse Father   .  Hypertension Father   . Hyperlipidemia Father   . Stroke Father   . CAD Father   . Lymphoma Maternal Grandfather   . Heart disease Maternal Grandfather     Review of Systems:  As stated in the HPI and otherwise negative.   BP (!) 142/84   Pulse 70   Ht 5\' 11"  (1.803 m)   Wt 207 lb (93.9 kg)   SpO2 97%   BMI 28.87 kg/m   Physical Examination: General: Well developed, well nourished, NAD  HEENT: OP clear, mucus membranes moist  SKIN: warm, dry. No rashes. Neuro: No focal deficits  Musculoskeletal: Muscle strength 5/5 all ext  Psychiatric: Mood and affect normal  Neck: No JVD, no carotid bruits, no thyromegaly, no lymphadenopathy.  Lungs:Clear bilaterally, no wheezes, rhonci, crackles Cardiovascular: Regular rate and rhythm. No murmurs, gallops  or rubs. Abdomen:Soft. Bowel sounds present. Non-tender.  Extremities: No lower extremity edema. Pulses are 2 + in the bilateral DP/PT.  EKG:  EKG is ordered today. The ekg ordered today demonstrates sinus, incomplete RBBB  Recent Labs: No results found for requested labs within last 8760 hours.   Lipid Panel    Component Value Date/Time   CHOL 198 08/16/2016 1131   TRIG 164 (H) 08/16/2016 1131   HDL 49 08/16/2016 1131   CHOLHDL 4.0 08/16/2016 1131     Wt Readings from Last 3 Encounters:  02/25/20 207 lb (93.9 kg)  05/27/17 181 lb (82.1 kg)  05/10/17 193 lb (87.5 kg)     Assessment and Plan:   1. Palpitations: Will arrange 14 day Zio patch monitor and echocardiogram.   Current medicines are reviewed at length with the patient today.  The patient does not have concerns regarding medicines.  The following changes have been made:  no change  Labs/ tests ordered today include:   Orders Placed This Encounter  Procedures  . LONG TERM MONITOR (3-14 DAYS)  . EKG 12-Lead  . ECHOCARDIOGRAM COMPLETE     Disposition:   F/U with me in 6 weeks.    Signed, 07/08/17, MD 02/25/2020 3:24 PM    [facility] [address] [place], [place], [place]  [place] Phone: [phone]; Fax: [phone]

## 2020-02-26 ENCOUNTER — Encounter: Payer: Self-pay | Admitting: *Deleted

## 2020-02-26 NOTE — Progress Notes (Signed)
Patient ID: Nicolas Warren, male   DOB: 09/13/1985, 34 y.o.   MRN: [id number] Patient enrolled for [employer] to ship a 14 day ZIO XT long term holter monitor to his home.

## 2020-03-02 ENCOUNTER — Ambulatory Visit (INDEPENDENT_AMBULATORY_CARE_PROVIDER_SITE_OTHER): Payer: 59

## 2020-03-02 DIAGNOSIS — R002 Palpitations: Secondary | ICD-10-CM | POA: Diagnosis not present

## 2020-04-04 ENCOUNTER — Other Ambulatory Visit (HOSPITAL_COMMUNITY): Payer: 59

## 2020-04-09 ENCOUNTER — Telehealth: Payer: Self-pay | Admitting: *Deleted

## 2020-04-09 NOTE — Telephone Encounter (Signed)
Left message for patient to call back  

## 2020-04-09 NOTE — Telephone Encounter (Signed)
-----   Message from Kathleene Hazel, MD sent at 04/02/2020 11:04 AM EST ----- Can we let him know that his monitor looks ok? Sinus with rare PACs. cdm

## 2020-04-17 ENCOUNTER — Encounter: Payer: Self-pay | Admitting: *Deleted

## 2020-04-17 NOTE — Telephone Encounter (Signed)
Left message for pt to call back and sent letter.

## 2020-04-25 ENCOUNTER — Other Ambulatory Visit (HOSPITAL_COMMUNITY): Payer: 59

## 2020-05-02 ENCOUNTER — Ambulatory Visit (HOSPITAL_COMMUNITY): Payer: 59

## 2020-05-23 ENCOUNTER — Ambulatory Visit (HOSPITAL_COMMUNITY): Payer: 59 | Attending: Internal Medicine

## 2020-05-23 ENCOUNTER — Other Ambulatory Visit: Payer: Self-pay

## 2020-05-23 DIAGNOSIS — R002 Palpitations: Secondary | ICD-10-CM | POA: Insufficient documentation

## 2020-05-23 LAB — ECHOCARDIOGRAM COMPLETE
Area-P 1/2: 5.27 cm2
S' Lateral: 3.3 cm

## 2021-12-29 NOTE — Progress Notes (Deleted)
No chief complaint on file.  History of Present Illness: 36 yo male with no past medical history who is here today for cardiac follow up. I saw him as a new patient for the evaluation of palpitations in November 2021. EKG in 2015 showed sinus with incomplete RBBB. When he was seen in 2021 he reported onset of right sided chest pain and palpitations. The episodes would last for several seconds. It felt like his heart was fluttering in his chest. No exertional chest pain. Workup at [facility] system included normal chest x-ray, normal TSH, electrolytes, troponin. Echo February 2022 with LVEF=60-65%, mild LVH. No valve disease. Cardiac monitor January 2022 with sinus with rare PACs.   He is here today for follow up. The patient denies any chest pain, dyspnea, palpitations, lower extremity edema, orthopnea, PND, dizziness, near syncope or syncope.    Primary Care Physician: Ulice Bold, MD  No past medical history on file.  No chronic medical problems  Past Surgical History:  Procedure Laterality Date   APPENDECTOMY     ORBITAL RECONSTRUCTION     SHOULDER ARTHROSCOPY      No current outpatient medications on file.   No current facility-administered medications for this visit.    No Known Allergies  Social History   Socioeconomic History   Marital status: Significant Other    Spouse name: Not on file   Number of children: 0   Years of education: Not on file   Highest education level: Not on file  Occupational History   Occupation: Lineman  Tobacco Use   Smoking status: Former    Types: Cigarettes   Smokeless tobacco: Current    Types: Chew   Tobacco comments:    1-5 per week  Substance and Sexual Activity   Alcohol use: Yes    Alcohol/week: 3.0 standard drinks of alcohol    Types: 3 Cans of beer per week   Drug use: No   Sexual activity: Yes    Birth control/protection: Pill  Other Topics Concern   Not on file  Social History Narrative   Not on file    Social Determinants of Health   Financial Resource Strain: Not on file  Food Insecurity: Not on file  Transportation Needs: Not on file  Physical Activity: Not on file  Stress: Not on file  Social Connections: Not on file  Intimate Partner Violence: Not on file    Family History  Problem Relation Age of Onset   Depression Mother    Thyroid cancer Mother    CAD Mother    Heart failure Father    Alcohol abuse Father    Hypertension Father    Hyperlipidemia Father    Stroke Father    CAD Father    Lymphoma Maternal Grandfather    Heart disease Maternal Grandfather     Review of Systems:  As stated in the HPI and otherwise negative.   There were no vitals taken for this visit.  Physical Examination: General: Well developed, well nourished, NAD  HEENT: OP clear, mucus membranes moist  SKIN: warm, dry. No rashes. Neuro: No focal deficits  Musculoskeletal: Muscle strength 5/5 all ext  Psychiatric: Mood and affect normal  Neck: No JVD, no carotid bruits, no thyromegaly, no lymphadenopathy.  Lungs:Clear bilaterally, no wheezes, rhonci, crackles Cardiovascular: Regular rate and rhythm. No murmurs, gallops or rubs. Abdomen:Soft. Bowel sounds present. Non-tender.  Extremities: No lower extremity edema. Pulses are 2 + in the bilateral DP/PT.  EKG:  EKG is *** ordered today. The ekg ordered today demonstrates   Echo February 2022:  1. Left ventricular ejection fraction, by estimation, is 60 to 65%. The  left ventricle has normal function. The left ventricle has no regional  wall motion abnormalities. There is mild left ventricular hypertrophy.  Left ventricular diastolic parameters  were normal.   2. Right ventricular systolic function is normal. The right ventricular  size is normal. Tricuspid regurgitation signal is inadequate for assessing  PA pressure.   3. The mitral valve is normal in structure. Trivial mitral valve  regurgitation. No evidence of mitral stenosis.    4. The aortic valve is tricuspid. Aortic valve regurgitation is not  visualized. No aortic stenosis is present.   5. The inferior vena cava is normal in size with greater than 50%  respiratory variability, suggesting right atrial pressure of 3 mmHg.   Recent Labs: No results found for requested labs within last 365 days.   Lipid Panel    Component Value Date/Time   CHOL 198 08/16/2016 1131   TRIG 164 (H) 08/16/2016 1131   HDL 49 08/16/2016 1131   CHOLHDL 4.0 08/16/2016 1131     Wt Readings from Last 3 Encounters:  02/25/20 207 lb (93.9 kg)  05/27/17 181 lb (82.1 kg)  05/10/17 193 lb (87.5 kg)     Assessment and Plan:   1. PACs: ***  Current medicines are reviewed at length with the patient today.  The patient does not have concerns regarding medicines.  The following changes have been made:  no change  Labs/ tests ordered today include:   No orders of the defined types were placed in this encounter.    Disposition:   F/U with me in 6 weeks.    Signed, Lauree Chandler, MD 12/29/2021 9:59 AM    [facility] [place], [place], [place]  [address] Phone: [phone]; Fax: [phone]

## 2021-12-30 ENCOUNTER — Ambulatory Visit: Payer: 59 | Admitting: Cardiovascular Disease
